# Patient Record
Sex: Female | Born: 1950 | Race: White | Hispanic: No | Marital: Married | Smoking: Light tobacco smoker
Health system: Southern US, Community
[De-identification: ages and names within clinical notes are randomized; demographics above are authoritative.]

## PROBLEM LIST (undated history)

## (undated) DIAGNOSIS — K56609 Unspecified intestinal obstruction, unspecified as to partial versus complete obstruction: Secondary | ICD-10-CM

## (undated) DIAGNOSIS — I714 Abdominal aortic aneurysm, without rupture: Principal | ICD-10-CM

## (undated) DIAGNOSIS — T8859XA Other complications of anesthesia, initial encounter: Secondary | ICD-10-CM

## (undated) DIAGNOSIS — R059 Cough, unspecified: Secondary | ICD-10-CM

## (undated) DIAGNOSIS — I10 Essential (primary) hypertension: Secondary | ICD-10-CM

## (undated) DIAGNOSIS — R519 Headache, unspecified: Secondary | ICD-10-CM

## (undated) DIAGNOSIS — T7840XA Allergy, unspecified, initial encounter: Secondary | ICD-10-CM

## (undated) DIAGNOSIS — R05 Cough: Secondary | ICD-10-CM

## (undated) DIAGNOSIS — R3129 Other microscopic hematuria: Secondary | ICD-10-CM

## (undated) DIAGNOSIS — R51 Headache: Secondary | ICD-10-CM

## (undated) DIAGNOSIS — K219 Gastro-esophageal reflux disease without esophagitis: Secondary | ICD-10-CM

## (undated) DIAGNOSIS — J069 Acute upper respiratory infection, unspecified: Secondary | ICD-10-CM

## (undated) DIAGNOSIS — T4145XA Adverse effect of unspecified anesthetic, initial encounter: Secondary | ICD-10-CM

## (undated) DIAGNOSIS — Z8709 Personal history of other diseases of the respiratory system: Secondary | ICD-10-CM

## (undated) DIAGNOSIS — G4700 Insomnia, unspecified: Secondary | ICD-10-CM

## (undated) DIAGNOSIS — E785 Hyperlipidemia, unspecified: Secondary | ICD-10-CM

## (undated) HISTORY — PX: COLONOSCOPY: SHX174

## (undated) HISTORY — DX: Allergy, unspecified, initial encounter: T78.40XA

## (undated) HISTORY — DX: Insomnia, unspecified: G47.00

## (undated) HISTORY — PX: TONSILLECTOMY: SUR1361

## (undated) HISTORY — DX: Hyperlipidemia, unspecified: E78.5

## (undated) HISTORY — DX: Essential (primary) hypertension: I10

## (undated) HISTORY — DX: Abdominal aortic aneurysm, without rupture: I71.4

---
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
## 1984-02-18 HISTORY — PX: BREAST FIBROADENOMA SURGERY: SHX580

## 1999-01-04 ENCOUNTER — Other Ambulatory Visit: Admission: RE | Admit: 1999-01-04 | Discharge: 1999-01-04 | Payer: Self-pay | Admitting: Gynecology

## 1999-12-05 ENCOUNTER — Encounter: Payer: Self-pay | Admitting: Gynecology

## 1999-12-05 ENCOUNTER — Ambulatory Visit (HOSPITAL_COMMUNITY): Admission: RE | Admit: 1999-12-05 | Discharge: 1999-12-05 | Payer: Self-pay | Admitting: Gynecology

## 2001-03-03 ENCOUNTER — Ambulatory Visit (HOSPITAL_COMMUNITY): Admission: RE | Admit: 2001-03-03 | Discharge: 2001-03-03 | Payer: Self-pay | Admitting: Emergency Medicine

## 2001-03-03 ENCOUNTER — Encounter: Payer: Self-pay | Admitting: Emergency Medicine

## 2002-04-06 ENCOUNTER — Encounter: Payer: Self-pay | Admitting: Emergency Medicine

## 2002-04-06 ENCOUNTER — Ambulatory Visit (HOSPITAL_COMMUNITY): Admission: RE | Admit: 2002-04-06 | Discharge: 2002-04-06 | Payer: Self-pay | Admitting: Emergency Medicine

## 2003-09-18 ENCOUNTER — Ambulatory Visit (HOSPITAL_COMMUNITY): Admission: RE | Admit: 2003-09-18 | Discharge: 2003-09-18 | Payer: Self-pay | Admitting: Emergency Medicine

## 2004-05-03 ENCOUNTER — Encounter: Admission: RE | Admit: 2004-05-03 | Discharge: 2004-05-03 | Payer: Self-pay | Admitting: Emergency Medicine

## 2004-09-19 ENCOUNTER — Ambulatory Visit (HOSPITAL_COMMUNITY): Admission: RE | Admit: 2004-09-19 | Discharge: 2004-09-19 | Payer: Self-pay | Admitting: Emergency Medicine

## 2005-07-04 ENCOUNTER — Emergency Department (HOSPITAL_COMMUNITY): Admission: EM | Admit: 2005-07-04 | Discharge: 2005-07-04 | Payer: Self-pay | Admitting: Emergency Medicine

## 2006-01-01 ENCOUNTER — Ambulatory Visit (HOSPITAL_COMMUNITY): Admission: RE | Admit: 2006-01-01 | Discharge: 2006-01-01 | Payer: Self-pay | Admitting: Emergency Medicine

## 2006-01-07 ENCOUNTER — Encounter: Admission: RE | Admit: 2006-01-07 | Discharge: 2006-01-07 | Payer: Self-pay | Admitting: Emergency Medicine

## 2007-09-08 ENCOUNTER — Encounter: Admission: RE | Admit: 2007-09-08 | Discharge: 2007-09-08 | Payer: Self-pay | Admitting: Emergency Medicine

## 2008-01-25 ENCOUNTER — Ambulatory Visit (HOSPITAL_COMMUNITY): Admission: RE | Admit: 2008-01-25 | Discharge: 2008-01-25 | Payer: Self-pay | Admitting: Emergency Medicine

## 2009-01-19 ENCOUNTER — Encounter: Admission: RE | Admit: 2009-01-19 | Discharge: 2009-01-19 | Payer: Self-pay | Admitting: Emergency Medicine

## 2010-03-10 ENCOUNTER — Encounter: Payer: Self-pay | Admitting: Emergency Medicine

## 2010-11-29 ENCOUNTER — Inpatient Hospital Stay (INDEPENDENT_AMBULATORY_CARE_PROVIDER_SITE_OTHER)
Admission: RE | Admit: 2010-11-29 | Discharge: 2010-11-29 | Disposition: A | Discharge status: Home / Self Care | Payer: Self-pay | Source: Ambulatory Visit | Attending: Emergency Medicine | Admitting: Emergency Medicine

## 2010-11-29 ENCOUNTER — Ambulatory Visit (INDEPENDENT_AMBULATORY_CARE_PROVIDER_SITE_OTHER): Payer: Self-pay

## 2010-11-29 DIAGNOSIS — R071 Chest pain on breathing: Secondary | ICD-10-CM

## 2010-11-29 DIAGNOSIS — N6019 Diffuse cystic mastopathy of unspecified breast: Secondary | ICD-10-CM

## 2011-05-14 ENCOUNTER — Encounter (HOSPITAL_COMMUNITY): Payer: Self-pay | Admitting: *Deleted

## 2011-05-14 ENCOUNTER — Emergency Department (INDEPENDENT_AMBULATORY_CARE_PROVIDER_SITE_OTHER)
Admission: EM | Admit: 2011-05-14 | Discharge: 2011-05-14 | Disposition: A | Discharge status: Home / Self Care | Payer: Self-pay | Source: Home / Self Care | Attending: Emergency Medicine | Admitting: Emergency Medicine

## 2011-05-14 DIAGNOSIS — K4090 Unilateral inguinal hernia, without obstruction or gangrene, not specified as recurrent: Secondary | ICD-10-CM

## 2011-05-14 DIAGNOSIS — N390 Urinary tract infection, site not specified: Secondary | ICD-10-CM

## 2011-05-14 MED ORDER — TRAMADOL HCL 50 MG PO TABS: 100.0000 mg | ORAL_TABLET | Freq: Three times a day (TID) | ORAL | Status: AC | PRN

## 2011-05-14 MED ORDER — SULFAMETHOXAZOLE-TMP DS 800-160 MG PO TABS: 1.0000 | ORAL_TABLET | Freq: Two times a day (BID) | ORAL | Status: AC

## 2011-05-14 NOTE — ED Notes (Signed)
Pt    Not in  tx room when went  To  discharge  Her  Dr  Lorenz Coaster   States  He   Explained  Plan of  Care  To pt   Prior  To  Discharge       Attempted  To  Call pt  At  Home  But  No  Answer        rx    Was  E  rx

## 2011-05-14 NOTE — ED Notes (Signed)
Called      Gloria Morrison  At  [facility]    And    Explained              Plan of  Care  She  Says      She        Understands  Info  Faxed  To the  Fax number  She provided   To me

## 2011-05-14 NOTE — Discharge Instructions (Signed)
Inguinal Hernia, Adult  Muscles help keep everything in the body in its proper place. But if a weak spot in the muscles develops, something can poke through. That is called a hernia. When this happens in the lower part of the belly (abdomen), it is called an inguinal hernia. (It takes its name from a part of the body in this region called the inguinal canal.) A weak spot in the wall of muscles lets some fat or part of the small intestine bulge through. An inguinal hernia can develop at any age. Men get them more often than women.  CAUSES   In adults, an inguinal hernia develops over time.  · It can be triggered by:  · Suddenly straining the muscles of the lower abdomen.  · Lifting heavy objects.  · Straining to have a bowel movement. Difficult bowel movements (constipation) can lead to this.  · Constant coughing. This may be caused by smoking or lung disease.  · Being overweight.  · Being pregnant.  · Working at a job that requires long periods of standing or heavy lifting.  · Having had an inguinal hernia before.  One type can be an emergency situation. It is called a strangulated inguinal hernia. It develops if part of the small intestine slips through the weak spot and cannot get back into the abdomen. The blood supply can be cut off. If that happens, part of the intestine may die. This situation requires emergency surgery.  SYMPTOMS   Often, a small inguinal hernia has no symptoms. It is found when a healthcare provider does a physical exam. Larger hernias usually have symptoms.   · In adults, symptoms may include:  · A lump in the groin. This is easier to see when the person is standing. It might disappear when lying down.  · In men, a lump in the scrotum.  · Pain or burning in the groin. This occurs especially when lifting, straining or coughing.  · A dull ache or feeling of pressure in the groin.  · Signs of a strangulated hernia can include:  · A bulge in the groin that becomes very painful and tender to the  touch.  · A bulge that turns red or purple.  · Fever, nausea and vomiting.  · Inability to have a bowel movement or to pass gas.  DIAGNOSIS   To decide if you have an inguinal hernia, a healthcare provider will probably do a physical examination.  · This will include asking questions about any symptoms you have noticed.  · The healthcare provider might feel the groin area and ask you to cough. If an inguinal hernia is felt, the healthcare provider may try to slide it back into the abdomen.  · Usually no other tests are needed.  TREATMENT   Treatments can vary. The size of the hernia makes a difference. Options include:  · Watchful waiting. This is often suggested if the hernia is small and you have had no symptoms.  · No medical procedure will be done unless symptoms develop.  · You will need to watch closely for symptoms. If any occur, contact your healthcare provider right away.  · Surgery. This is used if the hernia is larger or you have symptoms.  · Open surgery. This is usually an outpatient procedure (you will not stay overnight in a hospital). An cut (incision) is made through the skin in the groin. The hernia is put back inside the abdomen. The weak area in the muscles is   then repaired by herniorrhaphy or hernioplasty. Herniorrhaphy: in this type of surgery, the weak muscles are sewn back together. Hernioplasty: a patch or mesh is used to close the weak area in the abdominal wall.  · Laparoscopy. In this procedure, a surgeon makes small incisions. A thin tube with a tiny video camera (called a laparoscope) is put into the abdomen. The surgeon repairs the hernia with mesh by looking with the video camera and using two long instruments.  HOME CARE INSTRUCTIONS   · After surgery to repair an inguinal hernia:  · You will need to take pain medicine prescribed by your healthcare provider. Follow all directions carefully.  · You will need to take care of the wound from the incision.  · Your activity will be  restricted for awhile. This will probably include no heavy lifting for several weeks. You also should not do anything too active for a few weeks. When you can return to work will depend on the type of job that you have.  · During "watchful waiting" periods, you should:  · Maintain a healthy weight.  · Eat a diet high in fiber (fruits, vegetables and whole grains).  · Drink plenty of fluids to avoid constipation. This means drinking enough water and other liquids to keep your urine clear or pale yellow.  · Do not lift heavy objects.  · Do not stand for long periods of time.  · Quit smoking. This should keep you from developing a frequent cough.  SEEK MEDICAL CARE IF:   · A bulge develops in your groin area.  · You feel pain, a burning sensation or pressure in the groin. This might be worse if you are lifting or straining.  · You develop a fever of more than 100.5° F (38.1° C).  SEEK IMMEDIATE MEDICAL CARE IF:   · Pain in the groin increases suddenly.  · A bulge in the groin gets bigger suddenly and does not go down.  · For men, there is sudden pain in the scrotum. Or, the size of the scrotum increases.  · A bulge in the groin area becomes red or purple and is painful to touch.  · You have nausea or vomiting that does not go away.  · You feel your heart beating much faster than normal.  · You cannot have a bowel movement or pass gas.  · You develop a fever of more than 102.0° F (38.9° C).  Document Released: 06/22/2008 Document Revised: 01/23/2011 Document Reviewed: 06/22/2008  Exit[employer]® Patient Information ©2012 [employer].

## 2011-05-14 NOTE — ED Notes (Signed)
pT  REPORTS   PAIN DOWN  L  LEG   SHE  REPORTS        THE  PAIN IS  WORSE      WHEN  SHE  COUGHS        THE  PAIN  RUNS  DOWN THE  L  LEG         THE  SYMPTOMS   HAVE  BEEN  FOR  3  DAYS      SHE IS  AMBULATORY   TO  ROOM  WITH A  STEADY  UPRIGHT  GAIT

## 2011-05-14 NOTE — ED Provider Notes (Signed)
Chief Complaint  Patient presents with  . Leg Pain    History of Present Illness:   Gloria Morrison is a 61 year old female who has had a three-day history of pain in her left groin area. This seemed to occur after she had a bad cough. The pain is worse with coughing, straining, sitting up, moving, or lifting. The cough is now better, but the pain persisted and she also yesterday noted some pain in her left lateral calf and right lateral calf as well and some right hip pain, but this today seems to be better. She denies any fever, chills, nausea, or vomiting. There's been no urinary symptoms or GYN complaints. There's been no swelling of her legs, numbness, tingling, or weakness. She cannot feel a bulge or lump in the groin area.  She also mentions she thinks he is in early stages of a urinary tract infection. Sulfa has worked well for this in the past.  Review of Systems:  Other than noted above, the patient denies any of the following symptoms: Constitutional:  No fever, chills, fatigue, weight loss or anorexia. Lungs:  No cough or shortness of breath. Heart:  No chest pain, palpitations, syncope or edema. Abdomen:  No nausea, vomiting, hematememesis, melena, diarrhea, or hematochezia. GU:  No dysuria, frequency, urgency, or hematuria. Gyn:  No vaginal discharge, itching, abnormal bleeding or pelvic pain. Skin:  No rash or itching.  PMFSH:  Past medical history, family history, social history, meds, and allergies were reviewed.  Physical Exam:   Vital signs:  BP 133/88  Pulse 99  Temp(Src) 98.4 F (36.9 C) (Oral)  Resp 20  SpO2 98% Gen:  Alert, oriented, in no distress. Lungs:  Breath sounds clear and equal bilaterally.  No wheezes, rales or rhonchi. Heart:  Regular rhythm.  No gallops or murmers.   Abdomen:  Abdomen was soft, flat, and nondistended. There was some pain to palpation in the left in the area. I can feel a tiny inguinal hernia that is easily reducible but is tender to touch. There  is no inguinal lymphadenopathy. No organomegaly or mass. Bowel sounds are normally active. Extremities: No swelling, pulses were full, no calf tenderness, Homans sign was negative. Neurological exam: Normal muscle strength and sensation. Knee reflexes were 1+, ankle reflexes were 0. Straight leg raising was negative. Skin:  Clear, warm and dry.  No rash.  Assessment:   Diagnoses that have been ruled out:  None  Diagnoses that are still under consideration:  None  Final diagnoses:  Inguinal hernia  Urinary tract infection    Plan:   1.  The following meds were prescribed:   New Prescriptions   SULFAMETHOXAZOLE-TRIMETHOPRIM (BACTRIM DS) 800-160 MG PER TABLET    Take 1 tablet by mouth 2 (two) times daily.   TRAMADOL (ULTRAM) 50 MG TABLET    Take 2 tablets (100 mg total) by mouth every 8 (eight) hours as needed for pain.   2.  The patient was instructed in symptomatic care and handouts were given. 3.  The patient was told to return if becoming worse in any way, if no better in 3 or 4 days, and given some red flag symptoms that would indicate earlier return.  Follow up:  The patient was told to follow up with Dr. Ovidio Kin for the hernia as soon as possible. In the meantime she is to refrain from heavy lifting, straining, pushing, or pulling. She should apply ice to the area and rest as much as possible. She was  given tramadol for the pain.      Reuben Likes, MD 05/14/11 2222

## 2011-05-16 ENCOUNTER — Telehealth (HOSPITAL_COMMUNITY): Payer: Self-pay | Admitting: *Deleted

## 2011-05-16 NOTE — ED Notes (Signed)
Pt. called this AM and left message to call. I called pt. 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  She saw Dr. Lorenz Coaster for abd. Pain on 3/27 and was in the waiting room with a bunch if sick people.  She said now she has a sore throat, congestion and coughing and wants something for it. I told her we are not a primary care facility and we can't call in a Rx. without seeing her first. I told her this is a completely different problem than what she came for, so she will have to see the doctor again to be treated. Pt. asked when we open tomorrow and I said 0900. Vassie Moselle 05/16/2011

## 2012-07-24 DIAGNOSIS — N952 Postmenopausal atrophic vaginitis: Secondary | ICD-10-CM | POA: Insufficient documentation

## 2012-11-04 DIAGNOSIS — J309 Allergic rhinitis, unspecified: Secondary | ICD-10-CM | POA: Insufficient documentation

## 2012-11-04 DIAGNOSIS — G4700 Insomnia, unspecified: Secondary | ICD-10-CM | POA: Insufficient documentation

## 2012-11-04 DIAGNOSIS — E785 Hyperlipidemia, unspecified: Secondary | ICD-10-CM | POA: Insufficient documentation

## 2013-01-26 DIAGNOSIS — J40 Bronchitis, not specified as acute or chronic: Secondary | ICD-10-CM | POA: Insufficient documentation

## 2014-07-06 ENCOUNTER — Ambulatory Visit (INDEPENDENT_AMBULATORY_CARE_PROVIDER_SITE_OTHER): Payer: 59 | Admitting: Vascular Surgery

## 2014-07-06 ENCOUNTER — Encounter: Payer: Self-pay | Admitting: Vascular Surgery

## 2014-07-06 VITALS — BP 125/80 | HR 82 | Temp 98.0°F | Resp 18 | Ht 65.5 in | Wt 168.4 lb

## 2014-07-06 DIAGNOSIS — I714 Abdominal aortic aneurysm, without rupture, unspecified: Secondary | ICD-10-CM | POA: Insufficient documentation

## 2014-07-06 DIAGNOSIS — Z01810 Encounter for preprocedural cardiovascular examination: Secondary | ICD-10-CM

## 2014-07-06 HISTORY — DX: Abdominal aortic aneurysm, without rupture: I71.4

## 2014-07-06 HISTORY — DX: Abdominal aortic aneurysm, without rupture, unspecified: I71.40

## 2014-07-06 NOTE — Addendum Note (Signed)
Addended by: Mena Goes on: 07/06/2014 02:35 PM   Modules accepted: Orders

## 2014-07-06 NOTE — Progress Notes (Signed)
[facility]ISTORY AND PHYSICAL   History of Present Illness:  Patient is a 64 y.o. year old female who presents for evaluation of abdominal aortic aneurysm.  Patient was noted recently to have a 7 cm infrarenal abdominal aortic aneurysm on a CT scan done for evaluation of hematuria. The patient has known she had an aneurysm for approximately 8 years. She has had intermittent surveillance scans of this but admits that she had missed several scans in the last few years. She denies severe abdominal or back pain. She is a smoker of approximately one half pack per day. She is trying to quit. Other medical problems include hyperlipidemia, hypertension, microscopic hematuria no further workup planned. All of these current medical problems are stable.  Past Medical History  Diagnosis Date  . Hyperlipidemia   . Insomnia   . Postmenopausal atrophic vaginitis   . Bronchitis   . Hypertension   . Hematuria     Past Surgical History  Procedure Laterality Date  . Breast fibroadenoma surgery Left 1986  . Tonsillectomy      done as a child    Social History History  Substance Use Topics  . Smoking status: Current Every Day Smoker -- 0.25 packs/day  . Smokeless tobacco: Never Used  . Alcohol Use: 3.0 oz/week    5 Cans of beer per week    Family History Family History  Problem Relation Age of Onset  . Cancer Father     Melanoma  . Hypertension Sister     Allergies  No Known Allergies   Current Outpatient Prescriptions  Medication Sig Dispense Refill  . aspirin EC 81 MG tablet Take 81 mg by mouth.    . calcium carbonate 1250 MG capsule Take 1,250 mg by mouth.    . Coenzyme Q10 (CO Q 10) 10 MG CAPS Take 100 mg by mouth.    . estradiol (ESTRACE) 0.1 MG/GM vaginal cream Place vaginally.    Marland Kitchen lisinopril (PRINIVIL,ZESTRIL) 5 MG tablet Take 5 mg by mouth daily.     Marland Kitchen LORazepam (ATIVAN) 1 MG tablet Take 1 mg by mouth every 8 (eight) hours as needed.     .  Multiple Vitamins-Minerals (ONE DAILY PLUS MINERALS) TABS Take by mouth.     No current facility-administered medications for this visit.    ROS:   General:  No weight loss, Fever, chills  HEENT: No recent headaches, no nasal bleeding, no visual changes, no sore throat  Neurologic: No dizziness, blackouts, seizures. No recent symptoms of stroke or mini- stroke. No recent episodes of slurred speech, or temporary blindness.  Cardiac: No recent episodes of chest pain/pressure, no shortness of breath at rest.  No shortness of breath with exertion.  Denies history of atrial fibrillation or irregular heartbeat  Vascular: No history of rest pain in feet.  No history of claudication.  No history of non-healing ulcer, No history of DVT   Pulmonary: No home oxygen, no productive cough, no hemoptysis,  No asthma or wheezing  Musculoskeletal:  [ ]  Arthritis, [ ]  Low back pain,  [ ]  Joint pain  Hematologic:No history of hypercoagulable state.  No history of easy bleeding.  No history of anemia  Gastrointestinal: No hematochezia or melena,  No gastroesophageal reflux, no trouble swallowing  Urinary: [ ]  chronic Kidney disease, [ ]  on HD - [ ]  MWF or [ ]  TTHS, [ ]  Burning with urination, [ ]  Frequent urination, [ ]  Difficulty urinating;   Skin: No  rashes  Psychological: No history of anxiety,  No history of depression   Physical Examination  Filed Vitals:   07/06/14 1134  BP: 125/80  Pulse: 82  Temp: 98 F (36.7 C)  TempSrc: Oral  Resp: 18  Height: 5' 5.5" (1.664 m)  Weight: 168 lb 6.4 oz (76.386 kg)  SpO2: 98%    Body mass index is 27.59 kg/(m^2).  General:  Alert and oriented, no acute distress HEENT: Normal Neck: No bruit or JVD Pulmonary: Clear to auscultation bilaterally Cardiac: Regular Rate and Rhythm without murmur Abdomen: Soft, non-tender, non-distended, no mass, no scars Skin: No rash Extremity Pulses:  2+ radial, brachial, femoral, dorsalis pedis, posterior tibial  pulses bilaterally Musculoskeletal: No deformity or edema  Neurologic: Upper and lower extremity motor 5/5 and symmetric  DATA:  CT scan of the abdomen and pelvis a 5 mm cuts is reviewed today. This shows a 7 cm abdominal aortic aneurysm with some tortuosity of the iliacs but no aneurysmal dilatation of the iliac arteries. The infrarenal neck seems suitable for stent graft repair but will need a fine cut CT to further determine this   ASSESSMENT:  7 cm abdominal aortic aneurysm at risk of rupture.  PLAN:  The patient will undergo cardiac risk stratification. I have tentatively scheduled her for repair of her aneurysm on June 1 with a Gore Excluder stent graft. She will have a repeat CT scan with thin cuts for further measurement and planning for her aneurysm stent graft. She will try to quit smoking.  Ruta Hinds, MD [facility] [place] Office: [phone] Pager: [phone]

## 2014-07-07 ENCOUNTER — Encounter: Payer: Self-pay | Admitting: Vascular Surgery

## 2014-07-07 ENCOUNTER — Telehealth: Payer: Self-pay | Admitting: Vascular Surgery

## 2014-07-07 ENCOUNTER — Other Ambulatory Visit: Payer: Self-pay

## 2014-07-07 NOTE — Telephone Encounter (Signed)
Spoke with pt. Gave her the following: CTA 07/13/14 2:55 pm [facility] (labs already done & faxed) Cardiology appointment [facility] 07/12/14 1:20 pm Dr. Domenic Polite for preop clearance. Pt verbalized understanding.

## 2014-07-11 ENCOUNTER — Encounter: Payer: Self-pay | Admitting: Diagnostic Radiology

## 2014-07-12 ENCOUNTER — Encounter: Payer: Self-pay | Admitting: Cardiology

## 2014-07-12 ENCOUNTER — Ambulatory Visit (INDEPENDENT_AMBULATORY_CARE_PROVIDER_SITE_OTHER): Payer: 59 | Admitting: Cardiology

## 2014-07-12 ENCOUNTER — Other Ambulatory Visit: Payer: Self-pay | Admitting: *Deleted

## 2014-07-12 ENCOUNTER — Encounter: Payer: Self-pay | Admitting: *Deleted

## 2014-07-12 VITALS — BP 128/78 | HR 69 | Ht 66.0 in | Wt 169.0 lb

## 2014-07-12 DIAGNOSIS — I714 Abdominal aortic aneurysm, without rupture, unspecified: Secondary | ICD-10-CM

## 2014-07-12 DIAGNOSIS — I10 Essential (primary) hypertension: Secondary | ICD-10-CM | POA: Diagnosis not present

## 2014-07-12 DIAGNOSIS — Z01818 Encounter for other preprocedural examination: Secondary | ICD-10-CM

## 2014-07-12 DIAGNOSIS — Z136 Encounter for screening for cardiovascular disorders: Secondary | ICD-10-CM | POA: Diagnosis not present

## 2014-07-12 DIAGNOSIS — Z01810 Encounter for preprocedural cardiovascular examination: Secondary | ICD-10-CM

## 2014-07-12 DIAGNOSIS — E785 Hyperlipidemia, unspecified: Secondary | ICD-10-CM

## 2014-07-12 NOTE — Patient Instructions (Signed)
We will call you with test results  Your physician has requested that you have a lexiscan myoview. For further information please visit [email or web address]. Please follow instruction sheet, as given.  Thank you for choosing [facility]!

## 2014-07-12 NOTE — Progress Notes (Signed)
Cardiology Office Note  Date: 07/12/2014   ID: Gloria, Morrison 1950/04/16, MRN [id number]  PCP: Berkley Harvey, NP  Consulting Cardiologist: Rozann Lesches, MD   Chief Complaint  Patient presents with  . Preoperative evaluation    History of Present Illness: Gloria Morrison is a 64 y.o. female referred for cardiology consultation by Dr. Oneida Alar. She has a recently documented 7 cm infrarenal abdominal aortic aneurysm and is tentatively scheduled for stent graft repair on June 1.  I reviewed her available history. She has been aware of having an abdominal aortic aneurysm for several years now, and has been asymptomatic so far. She has a history of hypertension and hyperlipidemia, states that lisinopril is her most recent medication addition.    From a cardiac perspective, she denies any known history of CAD, cardiomyopathy, or arrhythmia. She states that she does Yoga regularly, and is trying to get back into a regular walking regimen. She does not specifically endorse any angina or limiting shortness of breath beyond NYHA class II. She does have a long-standing history of tobacco use, states that she is focused on trying to quit.  ECG done today shows normal sinus rhythm.  She has not undergone any prior cardiac ischemic testing.   Past Medical History  Diagnosis Date  . Hyperlipidemia   . Insomnia   . Postmenopausal atrophic vaginitis   . Bronchitis   . Essential hypertension   . Hematuria   . AAA (abdominal aortic aneurysm) without rupture 07/06/2014    Past Surgical History  Procedure Laterality Date  . Breast fibroadenoma surgery Left 1986  . Tonsillectomy      Current Outpatient Prescriptions  Medication Sig Dispense Refill  . aspirin EC 81 MG tablet Take 81 mg by mouth.    . calcium carbonate 1250 MG capsule Take 1,250 mg by mouth.    . Coenzyme Q10 (CO Q 10) 10 MG CAPS Take 100 mg by mouth.    . estradiol (ESTRACE) 0.1 MG/GM vaginal cream Place vaginally.      Marland Kitchen lisinopril (PRINIVIL,ZESTRIL) 5 MG tablet Take 5 mg by mouth daily.     Marland Kitchen LORazepam (ATIVAN) 1 MG tablet Take 1 mg by mouth every 8 (eight) hours as needed.     . Multiple Vitamins-Minerals (ONE DAILY PLUS MINERALS) TABS Take by mouth.     No current facility-administered medications for this visit.    Allergies:  Review of patient's allergies indicates no known allergies.   Social History: The patient  reports that she has been smoking Cigarettes.  She started smoking about 23 years ago. She has been smoking about 0.25 packs per day. She has never used smokeless tobacco. She reports that she drinks about 3.0 oz of alcohol per week. She reports that she does not use illicit drugs.   Family History: The patient's family history includes Hypertension in her sister; Melanoma in her father.   ROS:  Please see the history of present illness. Otherwise, complete review of systems is positive for none.  All other systems are reviewed and negative.   Physical Exam: VS:  BP 128/78 mmHg  Pulse 69  Ht 5\' 6"  (1.676 m)  Wt 169 lb (76.658 kg)  BMI 27.29 kg/m2  SpO2 96%, BMI Body mass index is 27.29 kg/(m^2).  Wt Readings from Last 3 Encounters:  07/12/14 169 lb (76.658 kg)  07/06/14 168 lb 6.4 oz (76.386 kg)     General: Patient appears comfortable at rest. HEENT: Conjunctiva and  lids normal, oropharynx clear. Neck: Supple, no elevated JVP or carotid bruits, no thyromegaly. Lungs: Clear to auscultation, nonlabored breathing at rest. Cardiac: Regular rate and rhythm, no S3 or significant systolic murmur, no pericardial rub. Abdomen: Soft, nontender, bowel sounds present, no guarding or rebound. Extremities: No pitting edema, distal pulses 2+. Skin: Warm and dry. Musculoskeletal: No kyphosis. Neuropsychiatric: Alert and oriented x3, affect grossly appropriate.   ECG: ECG is ordered today and shows normal sinus rhythm.  Recent Labwork:  02/16/2014: potassium 4.4, BUN 11, creatinine  0.4, AST 16, ALT 15 , hemoglobin 13.1, platelets 377 , cholesterol 221, triglycerides 156, HDL 58, LDL 132   ASSESSMENT AND PLAN:  1. Preoperative evaluation in a 64 year old woman with long-standing history of tobacco abuse, also hypertension and hyperlipidemia. She has a large infrarenal abdominal aortic aneurysm measuring 7 cm and is scheduled for a stent graft repair next week with Dr. Oneida Alar. Resting ECG is normal. She has not had any prior ischemic evaluation, and we will schedule a Lexiscan Cardiolite this week for further risk stratification.  2. Essential hypertension, on lisinopril, blood pressure is reasonable today.  3. History of hyperlipidemia, LDL 132. She is on CoQ10.  4. Tobacco abuse. Smoking cessation discussed, she seems to be motivated to quit.  Current medicines were reviewed at length with the patient today.   Orders Placed This Encounter  Procedures  . NM Myocar Multi W/Spect W/Wall Motion / EF  . Myocardial Perfusion Imaging  . EKG 12-Lead    Disposition: Call with results.   Signed, Satira Sark, MD, [facility] 07/12/2014 2:17 PM    [facility] [address], [place], [place] [address] Phone: [phone]; Fax: [phone]

## 2014-07-13 ENCOUNTER — Ambulatory Visit
Admission: RE | Admit: 2014-07-13 | Discharge: 2014-07-13 | Disposition: A | Discharge status: Home / Self Care | Payer: 59 | Source: Ambulatory Visit | Attending: Vascular Surgery | Admitting: Vascular Surgery

## 2014-07-13 MED ORDER — IOPAMIDOL (ISOVUE-370) INJECTION 76%
75.0000 mL | Freq: Once | INTRAVENOUS | Status: AC | PRN
  Administered 2014-07-13: 75 mL via INTRAVENOUS

## 2014-07-14 ENCOUNTER — Encounter (HOSPITAL_COMMUNITY)
Admission: RE | Admit: 2014-07-14 | Discharge: 2014-07-14 | Disposition: A | Discharge status: Home / Self Care | Payer: 59 | Source: Ambulatory Visit | Attending: Cardiology | Admitting: Cardiology

## 2014-07-14 ENCOUNTER — Inpatient Hospital Stay (HOSPITAL_COMMUNITY): Admission: RE | Admit: 2014-07-14 | Payer: 59 | Source: Ambulatory Visit

## 2014-07-14 ENCOUNTER — Telehealth: Payer: Self-pay | Admitting: Cardiology

## 2014-07-14 ENCOUNTER — Encounter (HOSPITAL_COMMUNITY): Payer: Self-pay

## 2014-07-14 DIAGNOSIS — Z01818 Encounter for other preprocedural examination: Secondary | ICD-10-CM | POA: Diagnosis present

## 2014-07-14 LAB — NM MYOCAR MULTI W/SPECT W/WALL MOTION / EF
CSEPPHR: 88 {beats}/min
LV dias vol: 77 mL
LV sys vol: 27 mL
NUC STRESS EF: 65 %
NUC STRESS TID: 1.13
Rest HR: 61 {beats}/min
SDS: 6
SRS: 0
SSS: 6

## 2014-07-14 MED ORDER — SODIUM CHLORIDE 0.9 % IJ SOLN
INTRAMUSCULAR | Status: AC
  Administered 2014-07-14: 10 mL via INTRAVENOUS
  Filled 2014-07-14: qty 3

## 2014-07-14 MED ORDER — SODIUM CHLORIDE 0.9 % IJ SOLN
10.0000 mL | INTRAMUSCULAR | Status: DC | PRN
  Administered 2014-07-14: 10 mL via INTRAVENOUS
  Filled 2014-07-14: qty 10

## 2014-07-14 MED ORDER — TECHNETIUM TC 99M SESTAMIBI GENERIC - CARDIOLITE
30.0000 | Freq: Once | INTRAVENOUS | Status: AC | PRN
  Administered 2014-07-14: 30 via INTRAVENOUS

## 2014-07-14 MED ORDER — REGADENOSON 0.4 MG/5ML IV SOLN
0.4000 mg | Freq: Once | INTRAVENOUS | Status: AC
  Administered 2014-07-14: 0.4 mg via INTRAVENOUS
  Filled 2014-07-14: qty 5

## 2014-07-14 MED ORDER — REGADENOSON 0.4 MG/5ML IV SOLN
INTRAVENOUS | Status: AC
Start: 2014-07-14 — End: 2014-07-14
  Administered 2014-07-14: 0.4 mg via INTRAVENOUS
  Filled 2014-07-14: qty 5

## 2014-07-14 MED ORDER — TECHNETIUM TC 99M SESTAMIBI - CARDIOLITE
10.0000 | Freq: Once | INTRAVENOUS | Status: AC | PRN
  Administered 2014-07-14: 08:00:00 10.2 via INTRAVENOUS

## 2014-07-14 NOTE — Telephone Encounter (Signed)
Results of tests done yesterday / tg

## 2014-07-18 ENCOUNTER — Encounter (HOSPITAL_COMMUNITY)
Admission: RE | Admit: 2014-07-18 | Discharge: 2014-07-18 | Disposition: A | Discharge status: Home / Self Care | Payer: 59 | Source: Ambulatory Visit | Attending: Vascular Surgery | Admitting: Vascular Surgery

## 2014-07-18 ENCOUNTER — Encounter (HOSPITAL_COMMUNITY): Payer: Self-pay

## 2014-07-18 ENCOUNTER — Other Ambulatory Visit: Payer: Self-pay

## 2014-07-18 DIAGNOSIS — N390 Urinary tract infection, site not specified: Secondary | ICD-10-CM

## 2014-07-18 HISTORY — DX: Other microscopic hematuria: R31.29

## 2014-07-18 HISTORY — DX: Other complications of anesthesia, initial encounter: T88.59XA

## 2014-07-18 HISTORY — DX: Adverse effect of unspecified anesthetic, initial encounter: T41.45XA

## 2014-07-18 HISTORY — DX: Cough, unspecified: R05.9

## 2014-07-18 HISTORY — DX: Headache, unspecified: R51.9

## 2014-07-18 HISTORY — DX: Headache: R51

## 2014-07-18 HISTORY — DX: Gastro-esophageal reflux disease without esophagitis: K21.9

## 2014-07-18 HISTORY — DX: Cough: R05

## 2014-07-18 HISTORY — DX: Personal history of other diseases of the respiratory system: Z87.09

## 2014-07-18 HISTORY — DX: Acute upper respiratory infection, unspecified: J06.9

## 2014-07-18 LAB — COMPREHENSIVE METABOLIC PANEL
ALBUMIN: 3.8 g/dL (ref 3.5–5.0)
ALK PHOS: 76 U/L (ref 38–126)
ALT: 17 U/L (ref 14–54)
AST: 17 U/L (ref 15–41)
Anion gap: 13 (ref 5–15)
BUN: 19 mg/dL (ref 6–20)
CALCIUM: 9.4 mg/dL (ref 8.9–10.3)
CHLORIDE: 97 mmol/L — AB (ref 101–111)
CO2: 22 mmol/L (ref 22–32)
Creatinine, Ser: 0.48 mg/dL (ref 0.44–1.00)
GFR calc non Af Amer: >60 mL/min (ref >60)
GLUCOSE: 103 mg/dL — AB (ref 65–99)
POTASSIUM: 4.2 mmol/L (ref 3.5–5.1)
SODIUM: 132 mmol/L — AB (ref 135–145)
TOTAL PROTEIN: 7 g/dL (ref 6.5–8.1)
Total Bilirubin: 0.6 mg/dL (ref 0.3–1.2)

## 2014-07-18 LAB — APTT: aPTT: 37 seconds (ref 24–37)

## 2014-07-18 LAB — CBC
HCT: 38.8 % (ref 36.0–46.0)
Hemoglobin: 13.3 g/dL (ref 12.0–15.0)
MCH: 30.2 pg (ref 26.0–34.0)
MCHC: 34.3 g/dL (ref 30.0–36.0)
MCV: 88.2 fL (ref 78.0–100.0)
PLATELETS: 340 10*3/uL (ref 150–400)
RBC: 4.4 MIL/uL (ref 3.87–5.11)
RDW: 14.1 % (ref 11.5–15.5)
WBC: 11.3 10*3/uL — AB (ref 4.0–10.5)

## 2014-07-18 LAB — URINALYSIS, ROUTINE W REFLEX MICROSCOPIC
BILIRUBIN URINE: NEGATIVE
GLUCOSE, UA: NEGATIVE mg/dL
KETONES UR: NEGATIVE mg/dL
Nitrite: POSITIVE — AB
PROTEIN: NEGATIVE mg/dL
Specific Gravity, Urine: 1.007 (ref 1.005–1.030)
Urobilinogen, UA: 0.2 mg/dL (ref 0.0–1.0)
pH: 6.5 (ref 5.0–8.0)

## 2014-07-18 LAB — BLOOD GAS, ARTERIAL
Acid-base deficit: 0.3 mmol/L (ref 0.0–2.0)
Bicarbonate: 23.4 mEq/L (ref 20.0–24.0)
Drawn by: 206361
O2 SAT: 96.4 %
PATIENT TEMPERATURE: 98.6
PO2 ART: 81.6 mmHg (ref 80.0–100.0)
TCO2: 24.4 mmol/L (ref 0–100)
pCO2 arterial: 35.1 mmHg (ref 35.0–45.0)
pH, Arterial: 7.438 (ref 7.350–7.450)

## 2014-07-18 LAB — URINE MICROSCOPIC-ADD ON

## 2014-07-18 LAB — TYPE AND SCREEN
ABO/RH(D): A POS
ANTIBODY SCREEN: NEGATIVE

## 2014-07-18 LAB — SURGICAL PCR SCREEN
MRSA, PCR: NEGATIVE
STAPHYLOCOCCUS AUREUS: NEGATIVE

## 2014-07-18 LAB — PROTIME-INR
INR: 1.02 (ref 0.00–1.49)
Prothrombin Time: 13.6 seconds (ref 11.6–15.2)

## 2014-07-18 LAB — ABO/RH: ABO/RH(D): A POS

## 2014-07-18 MED ORDER — DEXTROSE 5 % IV SOLN
1.5000 g | INTRAVENOUS | Status: AC
  Administered 2014-07-19: 1.5 g via INTRAVENOUS
  Filled 2014-07-18: qty 1.5

## 2014-07-18 MED ORDER — SODIUM CHLORIDE 0.9 % IV SOLN: INTRAVENOUS | Status: DC

## 2014-07-18 MED ORDER — CHLORHEXIDINE GLUCONATE CLOTH 2 % EX PADS: 6.0000 | MEDICATED_PAD | Freq: Once | CUTANEOUS | Status: DC

## 2014-07-18 MED ORDER — CIPROFLOXACIN HCL 500 MG PO TABS: 500.0000 mg | ORAL_TABLET | Freq: Two times a day (BID) | ORAL | Status: DC

## 2014-07-18 NOTE — Pre-Procedure Instructions (Signed)
Gloria Morrison  07/18/2014      [facility] [place], [address] [place] [place] [address] Phone: [phone] Fax: [phone]  [id number] - [place], [address] AT [facility] OF [address] [place] [address] Phone: [phone] Fax: [phone]  [facility] [place], [address] [place] [address] Phone: [phone] Fax: [phone]    Your procedure is scheduled on Wed.June 1 @ 8:30 AM  Report to [facility] and report to Admitting at 6:30 AM  Call this number if you have problems the morning of surgery:  [phone]   Remember:  Do not eat food or drink liquids after midnight.  Take these medicines the morning of surgery with A SIP OF WATER:Ativan(Lorazepam)              Stop taking your CO Q10. No Goody's,BC's,Aleve,Ibuprofen,Fish Oil,or any Herbal Medications.    Do not wear jewelry, make-up or nail polish.  Do not wear lotions, powders, or perfumes.    Do not shave 48 hours prior to surgery.    Do not bring valuables to the hospital.  [facility] is not responsible for any belongings or valuables.  Contacts, dentures or bridgework may not be worn into surgery.  Leave your suitcase in the car.  After surgery it may be brought to your room.  For patients admitted to the hospital, discharge time will be determined by your treatment team.  Patients discharged the day of surgery will not be allowed to drive home.    Special instructions:  [facility] - Preparing for Surgery  Before surgery, you can play an important role.  Because skin is not sterile, your skin needs to be as free of germs as possible.  You can reduce the number of germs on you skin by washing with CHG (chlorahexidine gluconate) soap before surgery.  CHG is an antiseptic cleaner which kills germs and bonds with the skin to  continue killing germs even after washing.  Please DO NOT use if you have an allergy to CHG or antibacterial soaps.  If your skin becomes reddened/irritated stop using the CHG and inform your nurse when you arrive at Short Stay.  Do not shave (including legs and underarms) for at least 48 hours prior to the first CHG shower.  You may shave your face.  Please follow these instructions carefully:   1.  Shower with CHG Soap the night before surgery and the                                morning of Surgery.  2.  If you choose to wash your hair, wash your hair first as usual with your       normal shampoo.  3.  After you shampoo, rinse your hair and body thoroughly to remove the                      Shampoo.  4.  Use CHG as you would any other liquid soap.  You can apply chg directly       to the skin and wash gently with scrungie or a clean washcloth.  5.  Apply the CHG Soap to your body ONLY FROM THE NECK DOWN.  Do not use on open wounds or open sores.  Avoid contact with your eyes,       ears, mouth and genitals (private parts).  Wash genitals (private parts)       with your normal soap.  6.  Wash thoroughly, paying special attention to the area where your surgery        will be performed.  7.  Thoroughly rinse your body with warm water from the neck down.  8.  DO NOT shower/wash with your normal soap after using and rinsing off       the CHG Soap.  9.  Pat yourself dry with a clean towel.            10.  Wear clean pajamas.            11.  Place clean sheets on your bed the night of your first shower and do not        sleep with pets.  Day of Surgery  Do not apply any lotions/deoderants the morning of surgery.  Please wear clean clothes to the hospital/surgery center.    Please read over the following fact sheets that you were given. Pain Booklet, Coughing and Deep Breathing, Blood Transfusion Information, MRSA Information and Surgical Site Infection Prevention

## 2014-07-18 NOTE — Progress Notes (Signed)
Pre-op urinalysis results from today called to Dr. Oneida Alar. Per Dr. Oneida Alar request, Cipro 500mg  by mouth twice a day for 7 days ordered. Dr. Oneida Alar states to keep patient's surgery scheduled for tomorrow. Notified Ms. Wich of Dr. Oneida Alar orders. Patient verbalized understanding.

## 2014-07-18 NOTE — Progress Notes (Addendum)
Anesthesia Chart Review:  Pt is 64 year old female scheduled for abdominal aortic endovascular stent graft on 07/19/2014 with Dr. Oneida Alar.   PMH includes: HTN, hyperlipidemia, AAA, GERD. Pt reports she is "hard to wake up" after anesthesia, does not want versed. Current smoker. BMI 27.  Medications include: ASA, lisinopril  Preoperative labs reviewed.  Many bacteria on microscopic urinalysis; nitrite positive on UA. Notified Stephanie in Dr. Oneida Alar' office.   EKG 07/13/2014: NSR.   Nuclear stress test 07/14/2014:  There was no ST segment deviation noted during stress.  This is a low risk study.  Breast attenuation and nearby radiotracer uptake within the gut limit the study, however there are no obvious defects to suggest scar or ischemia. LVEF 65%.  Pt has cardiac clearance from Dr. Rozann Lesches found in result notes on stress test.   I anticipate UTI may interfere with surgery plans.   Willeen Cass, FNP-BC [facility] Stay Surgical Center/Anesthesiology Phone: [phone] 07/18/2014 1:44 PM

## 2014-07-18 NOTE — Progress Notes (Addendum)
Cardiologist is Dr.Samuel Domenic Polite with last visit 06-2014  Echo unsure if she has ever had this  Stress test report in epic from 07-14-14  EKG in epic from 07-13-14  Denies ever having a heart cath [facility]

## 2014-07-19 ENCOUNTER — Inpatient Hospital Stay (HOSPITAL_COMMUNITY): Payer: 59

## 2014-07-19 ENCOUNTER — Inpatient Hospital Stay (HOSPITAL_COMMUNITY)
Admission: RE | Admit: 2014-07-19 | Discharge: 2014-07-20 | DRG: 269 | Disposition: A | Discharge status: Home / Self Care | Payer: 59 | Source: Ambulatory Visit | Attending: Vascular Surgery | Admitting: Vascular Surgery

## 2014-07-19 ENCOUNTER — Encounter (HOSPITAL_COMMUNITY): Payer: Self-pay | Admitting: Certified Registered Nurse Anesthetist

## 2014-07-19 ENCOUNTER — Inpatient Hospital Stay (HOSPITAL_COMMUNITY): Payer: 59 | Admitting: Certified Registered Nurse Anesthetist

## 2014-07-19 ENCOUNTER — Encounter (HOSPITAL_COMMUNITY)
Admission: RE | Disposition: A | Discharge status: Home / Self Care | Payer: Self-pay | Source: Ambulatory Visit | Attending: Vascular Surgery

## 2014-07-19 ENCOUNTER — Inpatient Hospital Stay (HOSPITAL_COMMUNITY): Payer: 59 | Admitting: Emergency Medicine

## 2014-07-19 DIAGNOSIS — E785 Hyperlipidemia, unspecified: Secondary | ICD-10-CM | POA: Diagnosis present

## 2014-07-19 DIAGNOSIS — Z7982 Long term (current) use of aspirin: Secondary | ICD-10-CM | POA: Diagnosis not present

## 2014-07-19 DIAGNOSIS — I10 Essential (primary) hypertension: Secondary | ICD-10-CM | POA: Diagnosis present

## 2014-07-19 DIAGNOSIS — K219 Gastro-esophageal reflux disease without esophagitis: Secondary | ICD-10-CM | POA: Diagnosis present

## 2014-07-19 DIAGNOSIS — G4700 Insomnia, unspecified: Secondary | ICD-10-CM | POA: Diagnosis present

## 2014-07-19 DIAGNOSIS — Z9889 Other specified postprocedural states: Secondary | ICD-10-CM | POA: Insufficient documentation

## 2014-07-19 DIAGNOSIS — F17210 Nicotine dependence, cigarettes, uncomplicated: Secondary | ICD-10-CM | POA: Diagnosis present

## 2014-07-19 DIAGNOSIS — Z8679 Personal history of other diseases of the circulatory system: Secondary | ICD-10-CM | POA: Insufficient documentation

## 2014-07-19 DIAGNOSIS — N390 Urinary tract infection, site not specified: Secondary | ICD-10-CM | POA: Diagnosis present

## 2014-07-19 DIAGNOSIS — I714 Abdominal aortic aneurysm, without rupture, unspecified: Secondary | ICD-10-CM | POA: Diagnosis present

## 2014-07-19 DIAGNOSIS — Z8249 Family history of ischemic heart disease and other diseases of the circulatory system: Secondary | ICD-10-CM | POA: Diagnosis not present

## 2014-07-19 HISTORY — PX: ABDOMINAL AORTIC ENDOVASCULAR STENT GRAFT: SHX5707

## 2014-07-19 LAB — APTT: aPTT: 33 seconds (ref 24–37)

## 2014-07-19 LAB — BASIC METABOLIC PANEL
ANION GAP: 10 (ref 5–15)
BUN: 6 mg/dL (ref 6–20)
CALCIUM: 8.5 mg/dL — AB (ref 8.9–10.3)
CO2: 24 mmol/L (ref 22–32)
CREATININE: 0.51 mg/dL (ref 0.44–1.00)
Chloride: 101 mmol/L (ref 101–111)
GFR calc Af Amer: >60 mL/min (ref >60)
GFR calc non Af Amer: >60 mL/min (ref >60)
Glucose, Bld: 122 mg/dL — ABNORMAL HIGH (ref 65–99)
Potassium: 4.5 mmol/L (ref 3.5–5.1)
SODIUM: 135 mmol/L (ref 135–145)

## 2014-07-19 LAB — MAGNESIUM: Magnesium: 1.9 mg/dL (ref 1.7–2.4)

## 2014-07-19 LAB — CBC
HCT: 36.2 % (ref 36.0–46.0)
HEMOGLOBIN: 12.2 g/dL (ref 12.0–15.0)
MCH: 30 pg (ref 26.0–34.0)
MCHC: 33.7 g/dL (ref 30.0–36.0)
MCV: 89.2 fL (ref 78.0–100.0)
Platelets: 293 10*3/uL (ref 150–400)
RBC: 4.06 MIL/uL (ref 3.87–5.11)
RDW: 14.4 % (ref 11.5–15.5)
WBC: 9.8 10*3/uL (ref 4.0–10.5)

## 2014-07-19 LAB — PROTIME-INR
INR: 1.07 (ref 0.00–1.49)
Prothrombin Time: 14.1 seconds (ref 11.6–15.2)

## 2014-07-19 SURGERY — INSERTION, ENDOVASCULAR STENT GRAFT, AORTA, ABDOMINAL
Anesthesia: General | Site: Abdomen

## 2014-07-19 MED ORDER — HYDRALAZINE HCL 20 MG/ML IJ SOLN
5.0000 mg | INTRAMUSCULAR | Status: DC | PRN
  Administered 2014-07-20: 5 mg via INTRAVENOUS
  Filled 2014-07-19: qty 1

## 2014-07-19 MED ORDER — ARTIFICIAL TEARS OP OINT
TOPICAL_OINTMENT | OPHTHALMIC | Status: AC
  Filled 2014-07-19: qty 3.5

## 2014-07-19 MED ORDER — PROPOFOL 10 MG/ML IV BOLUS
INTRAVENOUS | Status: AC
  Filled 2014-07-19: qty 20

## 2014-07-19 MED ORDER — PANTOPRAZOLE SODIUM 40 MG PO TBEC
40.0000 mg | DELAYED_RELEASE_TABLET | Freq: Every day | ORAL | Status: DC
  Administered 2014-07-20: 40 mg via ORAL
  Filled 2014-07-19: qty 1

## 2014-07-19 MED ORDER — FENTANYL CITRATE (PF) 100 MCG/2ML IJ SOLN
INTRAMUSCULAR | Status: DC | PRN
  Administered 2014-07-19: 100 ug via INTRAVENOUS
  Administered 2014-07-19 (×3): 50 ug via INTRAVENOUS
  Administered 2014-07-19: 100 ug via INTRAVENOUS
  Administered 2014-07-19: 50 ug via INTRAVENOUS

## 2014-07-19 MED ORDER — SODIUM CHLORIDE 0.9 % IJ SOLN
INTRAMUSCULAR | Status: AC
  Filled 2014-07-19: qty 10

## 2014-07-19 MED ORDER — DEXAMETHASONE SODIUM PHOSPHATE 4 MG/ML IJ SOLN
INTRAMUSCULAR | Status: DC | PRN
  Administered 2014-07-19: 4 mg via INTRAVENOUS

## 2014-07-19 MED ORDER — LACTATED RINGERS IV SOLN
INTRAVENOUS | Status: DC | PRN
  Administered 2014-07-19: 08:00:00 via INTRAVENOUS

## 2014-07-19 MED ORDER — HEPARIN SODIUM (PORCINE) 1000 UNIT/ML IJ SOLN
INTRAMUSCULAR | Status: AC
  Filled 2014-07-19: qty 1

## 2014-07-19 MED ORDER — POTASSIUM CHLORIDE CRYS ER 20 MEQ PO TBCR
20.0000 meq | EXTENDED_RELEASE_TABLET | Freq: Every day | ORAL | Status: DC | PRN
Start: 2014-07-19 — End: 2014-07-20

## 2014-07-19 MED ORDER — EPHEDRINE SULFATE 50 MG/ML IJ SOLN
INTRAMUSCULAR | Status: AC
  Filled 2014-07-19: qty 1

## 2014-07-19 MED ORDER — KETOROLAC TROMETHAMINE 30 MG/ML IJ SOLN
INTRAMUSCULAR | Status: AC
  Filled 2014-07-19: qty 1

## 2014-07-19 MED ORDER — MAGNESIUM SULFATE 2 GM/50ML IV SOLN: 2.0000 g | Freq: Every day | INTRAVENOUS | Status: DC | PRN

## 2014-07-19 MED ORDER — ESTRADIOL 0.1 MG/GM VA CREA: 1.0000 | TOPICAL_CREAM | VAGINAL | Status: DC

## 2014-07-19 MED ORDER — HYDROMORPHONE HCL 1 MG/ML IJ SOLN
INTRAMUSCULAR | Status: AC
  Filled 2014-07-19: qty 1

## 2014-07-19 MED ORDER — DOCUSATE SODIUM 100 MG PO CAPS
100.0000 mg | ORAL_CAPSULE | Freq: Every day | ORAL | Status: DC
  Administered 2014-07-20: 100 mg via ORAL
  Filled 2014-07-19: qty 1

## 2014-07-19 MED ORDER — CIPROFLOXACIN HCL 500 MG PO TABS
500.0000 mg | ORAL_TABLET | Freq: Two times a day (BID) | ORAL | Status: DC
  Administered 2014-07-19 – 2014-07-20 (×2): 500 mg via ORAL
  Filled 2014-07-19 (×4): qty 1

## 2014-07-19 MED ORDER — NEOSTIGMINE METHYLSULFATE 10 MG/10ML IV SOLN
INTRAVENOUS | Status: DC | PRN
  Administered 2014-07-19: 3 mg via INTRAVENOUS

## 2014-07-19 MED ORDER — LORAZEPAM 1 MG PO TABS
1.0000 mg | ORAL_TABLET | Freq: Four times a day (QID) | ORAL | Status: DC | PRN
  Administered 2014-07-20: 1 mg via ORAL
  Filled 2014-07-19: qty 1

## 2014-07-19 MED ORDER — SODIUM CHLORIDE 0.9 % IV SOLN
INTRAVENOUS | Status: DC
Start: 2014-07-19 — End: 2014-07-20
  Administered 2014-07-19: 23:00:00 via INTRAVENOUS

## 2014-07-19 MED ORDER — METOPROLOL TARTRATE 1 MG/ML IV SOLN: 2.0000 mg | INTRAVENOUS | Status: DC | PRN

## 2014-07-19 MED ORDER — PROTAMINE SULFATE 10 MG/ML IV SOLN
INTRAVENOUS | Status: DC | PRN
  Administered 2014-07-19: 70 mg via INTRAVENOUS
  Administered 2014-07-19: 10 mg via INTRAVENOUS

## 2014-07-19 MED ORDER — LABETALOL HCL 5 MG/ML IV SOLN: 10.0000 mg | INTRAVENOUS | Status: DC | PRN

## 2014-07-19 MED ORDER — NEOSTIGMINE METHYLSULFATE 10 MG/10ML IV SOLN
INTRAVENOUS | Status: AC
  Filled 2014-07-19: qty 1

## 2014-07-19 MED ORDER — LABETALOL HCL 5 MG/ML IV SOLN
INTRAVENOUS | Status: AC
  Filled 2014-07-19: qty 4

## 2014-07-19 MED ORDER — HEPARIN SODIUM (PORCINE) 1000 UNIT/ML IJ SOLN
INTRAMUSCULAR | Status: DC | PRN
  Administered 2014-07-19: 8000 [IU] via INTRAVENOUS

## 2014-07-19 MED ORDER — IODIXANOL 320 MG/ML IV SOLN
INTRAVENOUS | Status: DC | PRN
  Administered 2014-07-19: 81.2 mL via INTRAVENOUS

## 2014-07-19 MED ORDER — PHENYLEPHRINE 40 MCG/ML (10ML) SYRINGE FOR IV PUSH (FOR BLOOD PRESSURE SUPPORT)
PREFILLED_SYRINGE | INTRAVENOUS | Status: AC
  Filled 2014-07-19: qty 10

## 2014-07-19 MED ORDER — SENNOSIDES-DOCUSATE SODIUM 8.6-50 MG PO TABS
1.0000 | ORAL_TABLET | Freq: Every evening | ORAL | Status: DC | PRN
  Filled 2014-07-19: qty 1

## 2014-07-19 MED ORDER — GUAIFENESIN-DM 100-10 MG/5ML PO SYRP: 15.0000 mL | ORAL_SOLUTION | ORAL | Status: DC | PRN

## 2014-07-19 MED ORDER — PROMETHAZINE HCL 25 MG/ML IJ SOLN: 6.2500 mg | INTRAMUSCULAR | Status: DC | PRN

## 2014-07-19 MED ORDER — GLYCOPYRROLATE 0.2 MG/ML IJ SOLN
INTRAMUSCULAR | Status: DC | PRN
  Administered 2014-07-19: 0.4 mg via INTRAVENOUS
  Administered 2014-07-19 (×2): 0.2 mg via INTRAVENOUS

## 2014-07-19 MED ORDER — ACETAMINOPHEN 650 MG RE SUPP: 325.0000 mg | RECTAL | Status: DC | PRN

## 2014-07-19 MED ORDER — HYDROCODONE-ACETAMINOPHEN 7.5-325 MG PO TABS: 1.0000 | ORAL_TABLET | Freq: Once | ORAL | Status: DC | PRN

## 2014-07-19 MED ORDER — ASPIRIN EC 81 MG PO TBEC
81.0000 mg | DELAYED_RELEASE_TABLET | Freq: Every day | ORAL | Status: DC
  Administered 2014-07-20: 81 mg via ORAL
  Filled 2014-07-19: qty 1

## 2014-07-19 MED ORDER — BISACODYL 10 MG RE SUPP: 10.0000 mg | Freq: Every day | RECTAL | Status: DC | PRN

## 2014-07-19 MED ORDER — ONDANSETRON HCL 4 MG/2ML IJ SOLN
INTRAMUSCULAR | Status: DC | PRN
  Administered 2014-07-19: 4 mg via INTRAVENOUS

## 2014-07-19 MED ORDER — ONDANSETRON HCL 4 MG/2ML IJ SOLN
INTRAMUSCULAR | Status: AC
  Filled 2014-07-19: qty 2

## 2014-07-19 MED ORDER — MIDAZOLAM HCL 2 MG/2ML IJ SOLN
INTRAMUSCULAR | Status: AC
  Filled 2014-07-19: qty 2

## 2014-07-19 MED ORDER — DEXAMETHASONE SODIUM PHOSPHATE 4 MG/ML IJ SOLN
INTRAMUSCULAR | Status: AC
  Filled 2014-07-19: qty 1

## 2014-07-19 MED ORDER — PROPOFOL 10 MG/ML IV BOLUS
INTRAVENOUS | Status: DC | PRN
  Administered 2014-07-19: 50 mg via INTRAVENOUS

## 2014-07-19 MED ORDER — LABETALOL HCL 5 MG/ML IV SOLN
INTRAVENOUS | Status: DC | PRN
  Administered 2014-07-19: 5 mg via INTRAVENOUS

## 2014-07-19 MED ORDER — MORPHINE SULFATE 2 MG/ML IJ SOLN: 2.0000 mg | INTRAMUSCULAR | Status: DC | PRN

## 2014-07-19 MED ORDER — FENTANYL CITRATE (PF) 250 MCG/5ML IJ SOLN
INTRAMUSCULAR | Status: AC
  Filled 2014-07-19: qty 5

## 2014-07-19 MED ORDER — ALUM & MAG HYDROXIDE-SIMETH 200-200-20 MG/5ML PO SUSP: 15.0000 mL | ORAL | Status: DC | PRN

## 2014-07-19 MED ORDER — ACETAMINOPHEN 325 MG PO TABS
325.0000 mg | ORAL_TABLET | ORAL | Status: DC | PRN
  Administered 2014-07-19: 650 mg via ORAL
  Filled 2014-07-19: qty 2

## 2014-07-19 MED ORDER — ROCURONIUM BROMIDE 100 MG/10ML IV SOLN
INTRAVENOUS | Status: DC | PRN
  Administered 2014-07-19: 40 mg via INTRAVENOUS
  Administered 2014-07-19: 10 mg via INTRAVENOUS

## 2014-07-19 MED ORDER — ONDANSETRON HCL 4 MG/2ML IJ SOLN: 4.0000 mg | Freq: Four times a day (QID) | INTRAMUSCULAR | Status: DC | PRN

## 2014-07-19 MED ORDER — KETOROLAC TROMETHAMINE 30 MG/ML IJ SOLN
30.0000 mg | Freq: Once | INTRAMUSCULAR | Status: AC | PRN
  Administered 2014-07-19: 30 mg via INTRAVENOUS

## 2014-07-19 MED ORDER — LIDOCAINE HCL (CARDIAC) 20 MG/ML IV SOLN
INTRAVENOUS | Status: DC | PRN
  Administered 2014-07-19: 60 mg via INTRAVENOUS

## 2014-07-19 MED ORDER — DEXTROSE 5 % IV SOLN
1.5000 g | Freq: Two times a day (BID) | INTRAVENOUS | Status: AC
  Administered 2014-07-19 – 2014-07-20 (×2): 1.5 g via INTRAVENOUS
  Filled 2014-07-19 (×2): qty 1.5

## 2014-07-19 MED ORDER — ENOXAPARIN SODIUM 30 MG/0.3ML ~~LOC~~ SOLN
30.0000 mg | SUBCUTANEOUS | Status: DC
  Administered 2014-07-20: 30 mg via SUBCUTANEOUS
  Filled 2014-07-19 (×2): qty 0.3

## 2014-07-19 MED ORDER — LIDOCAINE HCL (CARDIAC) 20 MG/ML IV SOLN
INTRAVENOUS | Status: AC
  Filled 2014-07-19: qty 5

## 2014-07-19 MED ORDER — PROTAMINE SULFATE 10 MG/ML IV SOLN
INTRAVENOUS | Status: AC
  Filled 2014-07-19: qty 5

## 2014-07-19 MED ORDER — PHENOL 1.4 % MT LIQD: 1.0000 | OROMUCOSAL | Status: DC | PRN

## 2014-07-19 MED ORDER — ROCURONIUM BROMIDE 50 MG/5ML IV SOLN
INTRAVENOUS | Status: AC
  Filled 2014-07-19: qty 1

## 2014-07-19 MED ORDER — ARTIFICIAL TEARS OP OINT
TOPICAL_OINTMENT | OPHTHALMIC | Status: DC | PRN
  Administered 2014-07-19: 1 via OPHTHALMIC

## 2014-07-19 MED ORDER — LISINOPRIL 5 MG PO TABS
5.0000 mg | ORAL_TABLET | Freq: Every day | ORAL | Status: DC
  Administered 2014-07-19 – 2014-07-20 (×2): 5 mg via ORAL
  Filled 2014-07-19 (×2): qty 1

## 2014-07-19 MED ORDER — SODIUM CHLORIDE 0.9 % IV SOLN: 500.0000 mL | Freq: Once | INTRAVENOUS | Status: AC | PRN

## 2014-07-19 MED ORDER — SODIUM CHLORIDE 0.9 % IR SOLN
Status: DC | PRN
  Administered 2014-07-19: 10:00:00

## 2014-07-19 MED ORDER — HYDROCODONE-ACETAMINOPHEN 7.5-325 MG PO TABS
ORAL_TABLET | ORAL | Status: AC
  Filled 2014-07-19: qty 1

## 2014-07-19 MED ORDER — HYDROMORPHONE HCL 1 MG/ML IJ SOLN: 0.2500 mg | INTRAMUSCULAR | Status: DC | PRN

## 2014-07-19 MED ORDER — SUCCINYLCHOLINE CHLORIDE 20 MG/ML IJ SOLN
INTRAMUSCULAR | Status: AC
  Filled 2014-07-19: qty 1

## 2014-07-19 MED ORDER — OXYCODONE-ACETAMINOPHEN 5-325 MG PO TABS
1.0000 | ORAL_TABLET | ORAL | Status: DC | PRN
  Administered 2014-07-20: 1 via ORAL
  Filled 2014-07-19: qty 1

## 2014-07-19 MED ORDER — GLYCOPYRROLATE 0.2 MG/ML IJ SOLN
INTRAMUSCULAR | Status: AC
  Filled 2014-07-19: qty 3

## 2014-07-19 MED ORDER — DEXTROSE 5 % IV SOLN
10.0000 mg | INTRAVENOUS | Status: DC | PRN
  Administered 2014-07-19: 25 ug/min via INTRAVENOUS

## 2014-07-19 MED ORDER — 0.9 % SODIUM CHLORIDE (POUR BTL) OPTIME
TOPICAL | Status: DC | PRN
  Administered 2014-07-19: 1000 mL

## 2014-07-19 MED ORDER — EPHEDRINE SULFATE 50 MG/ML IJ SOLN
INTRAMUSCULAR | Status: DC | PRN
  Administered 2014-07-19 (×2): 5 mg via INTRAVENOUS

## 2014-07-19 SURGICAL SUPPLY — 46 items
CANISTER SUCTION 2500CC (MISCELLANEOUS) ×2 IMPLANT
CATH ANGIO 5F BER2 65CM (CATHETERS) ×2 IMPLANT
CATH BEACON 5.038 65CM KMP-01 (CATHETERS) IMPLANT
CATH OMNI FLUSH .035X70CM (CATHETERS) ×2 IMPLANT
COVER PROBE W GEL 5X96 (DRAPES) ×2 IMPLANT
DEVICE CLOSURE PERCLS PRGLD 6F (VASCULAR PRODUCTS) ×6 IMPLANT
DRSG TEGADERM 2-3/8X2-3/4 SM (GAUZE/BANDAGES/DRESSINGS) ×4 IMPLANT
DRYSEAL FLEXSHEATH 12FR 33CM (SHEATH) ×2
ELECT REM PT RETURN 9FT ADLT (ELECTROSURGICAL) ×4
ELECTRODE REM PT RTRN 9FT ADLT (ELECTROSURGICAL) ×2 IMPLANT
EXCLUDER TNK 28.5X14.5X18CM (Endovascular Graft) ×1 IMPLANT
EXCLUDER TRUNK 28.5X14.5X18CM (Endovascular Graft) ×2 IMPLANT
GAUZE SPONGE 2X2 8PLY STRL LF (GAUZE/BANDAGES/DRESSINGS) ×1 IMPLANT
GLOVE BIO SURGEON STRL SZ7.5 (GLOVE) ×2 IMPLANT
GOWN STRL REUS W/ TWL LRG LVL3 (GOWN DISPOSABLE) ×4 IMPLANT
GOWN STRL REUS W/TWL LRG LVL3 (GOWN DISPOSABLE) ×4
GRAFT BALLN CATH 65CM (STENTS) ×1 IMPLANT
GRAFT EXCLUDER AORTIC 28X3.3CM (Endovascular Graft) ×2 IMPLANT
GRAFT EXCLUDER LEG 14.5X14 (Endovascular Graft) ×2 IMPLANT
KIT BASIN OR (CUSTOM PROCEDURE TRAY) ×2 IMPLANT
KIT ROOM TURNOVER OR (KITS) ×2 IMPLANT
LIQUID BAND (GAUZE/BANDAGES/DRESSINGS) ×2 IMPLANT
NEEDLE PERC 18GX7CM (NEEDLE) ×2 IMPLANT
NS IRRIG 1000ML POUR BTL (IV SOLUTION) ×2 IMPLANT
PACK ENDOVASCULAR (PACKS) ×2 IMPLANT
PAD ARMBOARD 7.5X6 YLW CONV (MISCELLANEOUS) ×4 IMPLANT
PERCLOSE PROGLIDE 6F (VASCULAR PRODUCTS) ×12
SHEATH AVANTI 11CM 8FR (MISCELLANEOUS) ×2 IMPLANT
SHEATH BRITE TIP 8FR 23CM (MISCELLANEOUS) ×2 IMPLANT
SHEATH DRYSEAL FLEX 12FR 33CM (SHEATH) ×2 IMPLANT
SPONGE GAUZE 2X2 STER 10/PKG (GAUZE/BANDAGES/DRESSINGS) ×1
SPONGE SURGIFOAM ABS GEL 100 (HEMOSTASIS) IMPLANT
STAPLER VISISTAT 35W (STAPLE) IMPLANT
STENT GRAFT BALLN CATH 65CM (STENTS) ×1
STOPCOCK MORSE 400PSI 3WAY (MISCELLANEOUS) ×2 IMPLANT
SUT PROLENE 5 0 C 1 24 (SUTURE) IMPLANT
SUT VIC AB 2-0 CT1 27 (SUTURE)
SUT VIC AB 2-0 CT1 TAPERPNT 27 (SUTURE) IMPLANT
SUT VIC AB 3-0 SH 27 (SUTURE) ×2
SUT VIC AB 3-0 SH 27X BRD (SUTURE) ×2 IMPLANT
SUT VICRYL 4-0 PS2 18IN ABS (SUTURE) ×4 IMPLANT
SYR 30ML LL (SYRINGE) ×2 IMPLANT
TRAY FOLEY CATH 16FRSI W/METER (SET/KITS/TRAYS/PACK) ×2 IMPLANT
TUBING HIGH PRESSURE 120CM (CONNECTOR) ×2 IMPLANT
WIRE AMPLATZ SS-J .035X180CM (WIRE) ×4 IMPLANT
WIRE BENTSON .035X145CM (WIRE) ×4 IMPLANT

## 2014-07-19 NOTE — Anesthesia Preprocedure Evaluation (Addendum)
Anesthesia Evaluation  Patient identified by MRN, date of birth, ID band Patient awake    Reviewed: Allergy & Precautions, NPO status , Patient's Chart, lab work & pertinent test results  History of Anesthesia Complications (+) PROLONGED EMERGENCE  Airway Mallampati: II  TM Distance: >3 FB Neck ROM: Full    Dental   Pulmonary Current Smoker,  breath sounds clear to auscultation        Cardiovascular hypertension, Pt. on medications + Peripheral Vascular Disease Rhythm:Regular Rate:Normal  Nuclear stress test 07/14/2014:There was no ST segment deviation noted during stress.This is a low risk study. Breast attenuation and nearby radiotracer uptake within the gut limit the study, however there are no obvious defects to suggest scar or ischemia. LVEF 65%.   Neuro/Psych negative neurological ROS     GI/Hepatic Neg liver ROS, GERD-  ,  Endo/Other  negative endocrine ROS  Renal/GU negative Renal ROS     Musculoskeletal   Abdominal   Peds  Hematology negative hematology ROS (+)   Anesthesia Other Findings   Reproductive/Obstetrics                            Lab Results  Component Value Date   WBC 11.3* 07/18/2014   HGB 13.3 07/18/2014   HCT 38.8 07/18/2014   MCV 88.2 07/18/2014   PLT 340 07/18/2014   Lab Results  Component Value Date   CREATININE 0.48 07/18/2014   BUN 19 07/18/2014   NA 132* 07/18/2014   K 4.2 07/18/2014   CL 97* 07/18/2014   CO2 22 07/18/2014    Anesthesia Physical Anesthesia Plan  ASA: III  Anesthesia Plan: General   Post-op Pain Management:    Induction: Intravenous  Airway Management Planned: Oral ETT  Additional Equipment: Arterial line  Intra-op Plan:   Post-operative Plan: Extubation in OR  Informed Consent: I have reviewed the patients History and Physical, chart, labs and discussed the procedure including the risks, benefits and alternatives for  the proposed anesthesia with the patient or authorized representative who has indicated his/her understanding and acceptance.   Dental advisory given  Plan Discussed with: CRNA  Anesthesia Plan Comments:         Anesthesia Quick Evaluation

## 2014-07-19 NOTE — Anesthesia Procedure Notes (Signed)
Procedure Name: Intubation Date/Time: 07/19/2014 8:42 AM Performed by: Rogers Blocker Pre-anesthesia Checklist: Patient identified, Emergency Drugs available, Suction available, Patient being monitored and Timeout performed Patient Re-evaluated:Patient Re-evaluated prior to inductionOxygen Delivery Method: Circle system utilized Preoxygenation: Pre-oxygenation with 100% oxygen Intubation Type: IV induction Ventilation: Mask ventilation without difficulty and Oral airway inserted - appropriate to patient size Laryngoscope Size: Mac and 3 Grade View: Grade I Tube type: Oral Number of attempts: 1 Airway Equipment and Method: Stylet Placement Confirmation: ETT inserted through vocal cords under direct vision Secured at: 22 cm Tube secured with: Tape Dental Injury: Teeth and Oropharynx as per pre-operative assessment

## 2014-07-19 NOTE — Transfer of Care (Signed)
Immediate Anesthesia Transfer of Care Note  Patient: Gloria Morrison  Procedure(s) Performed: Procedure(s): ABDOMINAL AORTIC ENDOVASCULAR STENT GRAFT (N/A)  Patient Location: PACU  Anesthesia Type:General  Level of Consciousness: awake, alert , oriented and patient cooperative  Airway & Oxygen Therapy: Patient Spontanous Breathing and Patient connected to face mask oxygen  Post-op Assessment: Report given to RN, Post -op Vital signs reviewed and stable and Patient moving all extremities  Post vital signs: Reviewed and stable  Last Vitals:  Filed Vitals:   07/19/14 1103  BP:   Pulse:   Temp: 36.5 C  Resp:     Complications: No apparent anesthesia complications

## 2014-07-19 NOTE — Progress Notes (Signed)
Pt arrived from PACU, VSS, oriented to unit and routine, call bell within reach. CMT and elink notified.  Complaints of foley cath pain request removal. Notified Gerri Lins, PA and received order to remove.  No other complaints of pain at this time. Surgical sites soft, pulses palpable.   Will continue to monitor for changes.

## 2014-07-19 NOTE — Anesthesia Postprocedure Evaluation (Signed)
  Anesthesia Post-op Note  Patient: Gloria Morrison  Procedure(s) Performed: Procedure(s): ABDOMINAL AORTIC ENDOVASCULAR STENT GRAFT (N/A)  Patient Location: PACU  Anesthesia Type:General  Level of Consciousness: awake, alert  and oriented  Airway and Oxygen Therapy: Patient Spontanous Breathing  Post-op Pain: none  Post-op Assessment: Post-op Vital signs reviewed  Post-op Vital Signs: Reviewed  Last Vitals:  Filed Vitals:   07/19/14 1400  BP:   Pulse: 53  Temp:   Resp: 14    Complications: No apparent anesthesia complications

## 2014-07-19 NOTE — H&P (View-Only) (Signed)
[facility] HISTORY AND PHYSICAL   History of Present Illness:  Patient is a 64 y.o. year old female who presents for evaluation of abdominal aortic aneurysm.  Patient was noted recently to have a 7 cm infrarenal abdominal aortic aneurysm on a CT scan done for evaluation of hematuria. The patient has known she had an aneurysm for approximately 8 years. She has had intermittent surveillance scans of this but admits that she had missed several scans in the last few years. She denies severe abdominal or back pain. She is a smoker of approximately one half pack per day. She is trying to quit. Other medical problems include hyperlipidemia, hypertension, microscopic hematuria no further workup planned. All of these current medical problems are stable.  Past Medical History  Diagnosis Date  . Hyperlipidemia   . Insomnia   . Postmenopausal atrophic vaginitis   . Bronchitis   . Hypertension   . Hematuria     Past Surgical History  Procedure Laterality Date  . Breast fibroadenoma surgery Left 1986  . Tonsillectomy      done as a child    Social History History  Substance Use Topics  . Smoking status: Current Every Day Smoker -- 0.25 packs/day  . Smokeless tobacco: Never Used  . Alcohol Use: 3.0 oz/week    5 Cans of beer per week    Family History Family History  Problem Relation Age of Onset  . Cancer Father     Melanoma  . Hypertension Sister     Allergies  No Known Allergies   Current Outpatient Prescriptions  Medication Sig Dispense Refill  . aspirin EC 81 MG tablet Take 81 mg by mouth.    . calcium carbonate 1250 MG capsule Take 1,250 mg by mouth.    . Coenzyme Q10 (CO Q 10) 10 MG CAPS Take 100 mg by mouth.    . estradiol (ESTRACE) 0.1 MG/GM vaginal cream Place vaginally.    Marland Kitchen lisinopril (PRINIVIL,ZESTRIL) 5 MG tablet Take 5 mg by mouth daily.     Marland Kitchen LORazepam (ATIVAN) 1 MG tablet Take 1 mg by mouth every 8 (eight) hours as needed.     .  Multiple Vitamins-Minerals (ONE DAILY PLUS MINERALS) TABS Take by mouth.     No current facility-administered medications for this visit.    ROS:   General:  No weight loss, Fever, chills  HEENT: No recent headaches, no nasal bleeding, no visual changes, no sore throat  Neurologic: No dizziness, blackouts, seizures. No recent symptoms of stroke or mini- stroke. No recent episodes of slurred speech, or temporary blindness.  Cardiac: No recent episodes of chest pain/pressure, no shortness of breath at rest.  No shortness of breath with exertion.  Denies history of atrial fibrillation or irregular heartbeat  Vascular: No history of rest pain in feet.  No history of claudication.  No history of non-healing ulcer, No history of DVT   Pulmonary: No home oxygen, no productive cough, no hemoptysis,  No asthma or wheezing  Musculoskeletal:  [ ]  Arthritis, [ ]  Low back pain,  [ ]  Joint pain  Hematologic:No history of hypercoagulable state.  No history of easy bleeding.  No history of anemia  Gastrointestinal: No hematochezia or melena,  No gastroesophageal reflux, no trouble swallowing  Urinary: [ ]  chronic Kidney disease, [ ]  on HD - [ ]  MWF or [ ]  TTHS, [ ]  Burning with urination, [ ]  Frequent urination, [ ]  Difficulty urinating;   Skin: No  rashes  Psychological: No history of anxiety,  No history of depression   Physical Examination  Filed Vitals:   07/06/14 1134  BP: 125/80  Pulse: 82  Temp: 98 F (36.7 C)  TempSrc: Oral  Resp: 18  Height: 5' 5.5" (1.664 m)  Weight: 168 lb 6.4 oz (76.386 kg)  SpO2: 98%    Body mass index is 27.59 kg/(m^2).  General:  Alert and oriented, no acute distress HEENT: Normal Neck: No bruit or JVD Pulmonary: Clear to auscultation bilaterally Cardiac: Regular Rate and Rhythm without murmur Abdomen: Soft, non-tender, non-distended, no mass, no scars Skin: No rash Extremity Pulses:  2+ radial, brachial, femoral, dorsalis pedis, posterior tibial  pulses bilaterally Musculoskeletal: No deformity or edema  Neurologic: Upper and lower extremity motor 5/5 and symmetric  DATA:  CT scan of the abdomen and pelvis a 5 mm cuts is reviewed today. This shows a 7 cm abdominal aortic aneurysm with some tortuosity of the iliacs but no aneurysmal dilatation of the iliac arteries. The infrarenal neck seems suitable for stent graft repair but will need a fine cut CT to further determine this   ASSESSMENT:  7 cm abdominal aortic aneurysm at risk of rupture.  PLAN:  The patient will undergo cardiac risk stratification. I have tentatively scheduled her for repair of her aneurysm on June 1 with a Gore Excluder stent graft. She will have a repeat CT scan with thin cuts for further measurement and planning for her aneurysm stent graft. She will try to quit smoking.  Ruta Hinds, MD [facility] [place] [facility] Office: [phone] Pager: [phone]

## 2014-07-19 NOTE — Interval H&P Note (Signed)
History and Physical Interval Note:  07/19/2014 8:07 AM  Gloria Morrison  has presented today for surgery, with the diagnosis of Abdominal aortic aneurysm I71.4  The various methods of treatment have been discussed with the patient and family. After consideration of risks, benefits and other options for treatment, the patient has consented to  Procedure(s): ABDOMINAL AORTIC ENDOVASCULAR STENT GRAFT (N/A) as a surgical intervention .  The patient's history has been reviewed, patient examined, no change in status, stable for surgery.  I have reviewed the patient's chart and labs.  Questions were answered to the patient's satisfaction.     Ruta Hinds

## 2014-07-19 NOTE — Progress Notes (Signed)
      Alert and sitting up in chair  Palpable DP bilaterally 2+ Groin sites soft without hematoma   S/P EVAR Transfer to 3S Aurilla Coulibaly MAUREEN PA-C

## 2014-07-19 NOTE — Op Note (Addendum)
Procedure: AAA stent graft repair of infrarenal AAA. (Gore Excluder)  Preop: AAA Postop: same Anesthesia: General Asst: Gerri Lins, PA-C  Operative findings:   #1 Bilateral Proglide closure   #2 28x14x18 cm main body Gore Excluder device delivered via a right femoral system   #3 14 x 14 cm left iliac limb contralateral   PROCEDURE DETAIL: After obtaining informed consent the patient was taken to the operating room. The patient was placed in supine position on the operating room table. After induction of general anesthesia and endotracheal intubation a Foley catheter was placed. Next the patient was prepped and draped in usual sterile fashion from the nipples down to the knees. Ultrasound was used to identify the right common femoral artery as well as the femoral bifurcation. An 11 blade was used to make a small neck in the skin over the level of the right common femoral artery. An introducer needle was then used to cannulate the right common femoral artery without difficulty. A 0.035 Bentson wire was then threaded up into the abdominal aorta through the right femoral system. A short 9 French dilator was placed over the guidewire the right femoral system. This was used to dilate the tract. The dilator was then removed and a Proglide device inserted over the guidewire into the right femoral system and this was deployed at the 2:00 position. The Proglide device was then removed and an additional Proglide device was brought in operative field and deployed at the 10:00 position. The sutures were kept separate and tagged with suture tags. Next the short 9 French sheath was then placed back over the guidewire into the right femoral system and the dilator removed and the sheath thoroughly flushed with heparinized saline. Attention was then turned to the left groin. Again using ultrasound the left common femoral artery was identified. The femoral bifurcation was also identified. A small nick was made in  the skin with the 11 blade. A hemostat was used to dilate a tract down to the artery. An introducer needle was then used to cannulate the left common femoral artery without difficulty. A 0.035 Bentson wire was then threaded up into the abdominal aorta under fluoroscopic guidance. A 9 French dilator was then placed over the wire to dilate the tract. Two Proglide devices were again brought on operative field and these were fired sequentially in the 10:00 position followed by an additional Proglide at the 2:00 position. The Proglide delivery systems were removed and the long 9 French sheath placed back over the guidewire up to the level of the iliac of the aortic bifurcation.  At this point, a 5 [place] Omni flush catheter was placed over the guidewire and the left groin up into the abdominal aorta. An abdominal aortogram was obtained in a 15 degree cranial 15 degree LAO position to determine level of the left and right renal arteries. The patient was given 8000 units of heparin.  At this point a 28 x 14 x 18 Gore Excluder main body device was selected. The Bentson wire from the right groin was advanced up into the descending thoracic aorta over a Berenstein 2 catheter and the Bentsen wire replaced with an 035 Amplatz wire. An 035 amplatz wire was placed up the right femoral system and an 18 Fr dryseal sheath placed over this.The main body device was then placed over the Amplatz wire in the right groin and advanced up to the level of the renal arteries.  The top portion of the stent graft was then  deployed with the end of the stent just below the level of the left renal artery and this came over to just below the level of the right renal artery. The main body was delivered all the way down to the contralateral gate. Magnified views of the renal arteries were performed to make sure that we were not covering these.  Attention was then turned to the left groin. The Omni flush catheter was pulled down over an 035 amplatz  guidewire and removed and using the Bentson wire as a buddy wire, it was placed in position to cannulate the contralateral gate. The berenstein 2 catheter was placed back over the guidewire and this was used to selectively catheterize the contralateral gate and the guidewire was then advanced into the descending thoracic aorta. The main body portion of the gate was confirmed by twirling the omniflush catheter within the device. The omniflush  catheter was then placed in a location so that we could use its markers to determine the exact length to the iliac bifurcation. An Amplatz wire was placed back through the pigtail catheter. A retrograde contrast angiogram was performed to determine the level of the left internal iliac artery and a 14 x 14 cm iliac limb was selected. The pigtail catheter was removed over the guidewire and the 14 x 14 cm limb advanced so there was full coverage of the long marker on the contralateral limb. This was then deployed in the usual fashion down to the iliac bifurcation. The delivery system was then removed. The remainder of the ipsilateral iliac limb was also deployed.   Attention was then turned back to the left iliac system and a Gore aortic balloon placed over the wire up to the level of the proximal end of the stent and this was ballooned to profile. The limb attachment site was also ballooned as well as the distal attachment site. Attention was then turned to the right groin and the balloon was advanced over the guidewire on the right side and the distal attachment site as well as the midportion of iliac limb were also ballooned. The 5 [place] Omni flush catheter was then placed back to the guidewire on the right side and a completion arteriogram was obtained. This showed a proximal type 1 leak adjacent to the right renal artery.  This persisted after reballooning so a proximal cuff was brought on the field to reinforce the top end of the graft as this could not be extended without  covering the renals.  A 28 x 3.3 proximal cuff was deployed with flush overlap of the distal end and again ballooned with the Gore balloon.  This showed no evidence of proximal or distal type I endoleak. 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   At this point the Omni flush catheter was removed over a guidewire. All delivery devices were removed. We then proceeded to remove the large 18 French sheath from the right side with the guidewire in place. With pressure held above and below the insertion site the lateral Proglide closure was secured down.  An additional AP Proglide was then placed over the guidewire and deployed.  There was good hemostasis. The guidewire was removed from the right side. Attention was then turned to the left groin. In similar fashion the 12 French sheath was removed and the guidewire left in place.  The lateral Proglide was secured and 1 additional Proglides were deployed in the AP position over the wire to obtain hemostasis.  There was good hemostasis and the Bentsen wire was removed from the left groin. The patient had been given 8000 units of heparin before introducing the main body device. This was fully reversed with 80 mg of protamine at the end of the case after confirming doppler flow to the feet.  Each groin puncture site was then closed with a running 4-0 Vicryl subcuticular stitch.  The patient tolerated procedure well and there were no complications. Instrument sponge and needle counts correct in the case. Patient was awakened in the operating room extubated and taken to the recovery room in stable condition.  Ruta Hinds, MD [facility] [place] Office: [phone] Pager: [phone]

## 2014-07-20 ENCOUNTER — Encounter (HOSPITAL_COMMUNITY): Payer: Self-pay | Admitting: Vascular Surgery

## 2014-07-20 ENCOUNTER — Telehealth: Payer: Self-pay | Admitting: Vascular Surgery

## 2014-07-20 ENCOUNTER — Other Ambulatory Visit: Payer: Self-pay | Admitting: *Deleted

## 2014-07-20 DIAGNOSIS — I714 Abdominal aortic aneurysm, without rupture, unspecified: Secondary | ICD-10-CM

## 2014-07-20 DIAGNOSIS — Z48812 Encounter for surgical aftercare following surgery on the circulatory system: Secondary | ICD-10-CM

## 2014-07-20 LAB — BASIC METABOLIC PANEL
Anion gap: 12 (ref 5–15)
BUN: 6 mg/dL (ref 6–20)
CALCIUM: 8.7 mg/dL — AB (ref 8.9–10.3)
CO2: 24 mmol/L (ref 22–32)
CREATININE: 0.56 mg/dL (ref 0.44–1.00)
Chloride: 95 mmol/L — ABNORMAL LOW (ref 101–111)
GFR calc Af Amer: >60 mL/min (ref >60)
Glucose, Bld: 97 mg/dL (ref 65–99)
Potassium: 3.5 mmol/L (ref 3.5–5.1)
SODIUM: 131 mmol/L — AB (ref 135–145)

## 2014-07-20 LAB — CBC
HEMATOCRIT: 33.7 % — AB (ref 36.0–46.0)
Hemoglobin: 11.4 g/dL — ABNORMAL LOW (ref 12.0–15.0)
MCH: 30 pg (ref 26.0–34.0)
MCHC: 33.8 g/dL (ref 30.0–36.0)
MCV: 88.7 fL (ref 78.0–100.0)
Platelets: 311 10*3/uL (ref 150–400)
RBC: 3.8 MIL/uL — ABNORMAL LOW (ref 3.87–5.11)
RDW: 14.4 % (ref 11.5–15.5)
WBC: 12.9 10*3/uL — ABNORMAL HIGH (ref 4.0–10.5)

## 2014-07-20 MED ORDER — TRAMADOL HCL 50 MG PO TABS: 50.0000 mg | ORAL_TABLET | Freq: Four times a day (QID) | ORAL | Status: DC | PRN

## 2014-07-20 NOTE — Progress Notes (Signed)
Discussed discharge instructions and medications with patient and family. Both verbalized understanding with all questions answered. VSS. Pt discharged home with family.  Dura Mccormack,RN  

## 2014-07-20 NOTE — Discharge Summary (Signed)
EVAR Discharge Summary   Gloria Morrison 06/15/1950 64 y.o. female  MRN: [id number]  Admission Date: 07/19/2014  Discharge Date: 07/20/14  Physician: Elam Dutch, MD  Admission Diagnosis: Abdominal aortic aneurysm I71.4   HPI:   This is a 64 y.o. female who presents for evaluation of abdominal aortic aneurysm. Patient was noted recently to have a 7 cm infrarenal abdominal aortic aneurysm on a CT scan done for evaluation of hematuria. The patient has known she had an aneurysm for approximately 8 years. She has had intermittent surveillance scans of this but admits that she had missed several scans in the last few years. She denies severe abdominal or back pain. She is a smoker of approximately one half pack per day. She is trying to quit. Other medical problems include hyperlipidemia, hypertension, microscopic hematuria no further workup planned. All of these current medical problems are stable.  Hospital Course:  The patient was admitted to the hospital and taken to the operating room on 07/19/2014 and underwent: AAA stent graft repair of infrarenal AAA. (Gore Excluder)    The pt tolerated the procedure well and was transported to the PACU in good condition.   Pt did have a positive UTI prior to surgery and was started on Cipro.  The pt is told to finish the remainder of the prescription.    She did not want OxyIR and was given an Rx for Tramadol.    The remainder of the hospital course consisted of increasing mobilization and increasing intake of solids without difficulty.  CBC    Component Value Date/Time   WBC 12.9* 07/20/2014 0520   RBC 3.80* 07/20/2014 0520   HGB 11.4* 07/20/2014 0520   HCT 33.7* 07/20/2014 0520   PLT 311 07/20/2014 0520   MCV 88.7 07/20/2014 0520   MCH 30.0 07/20/2014 0520   MCHC 33.8 07/20/2014 0520   RDW 14.4 07/20/2014 0520    BMET    Component Value Date/Time   NA 131* 07/20/2014 0520   K 3.5 07/20/2014 0520   CL 95* 07/20/2014 0520     CO2 24 07/20/2014 0520   GLUCOSE 97 07/20/2014 0520   BUN 6 07/20/2014 0520   CREATININE 0.56 07/20/2014 0520   CALCIUM 8.7* 07/20/2014 0520   GFRNONAA >60 07/20/2014 0520   GFRAA >60 07/20/2014 0520       Discharge Instructions    ABDOMINAL PROCEDURE/ANEURYSM REPAIR/AORTO-BIFEMORAL BYPASS:  Call MD for increased abdominal pain; cramping diarrhea; nausea/vomiting    Complete by:  As directed      Call MD for:  redness, tenderness, or signs of infection (pain, swelling, bleeding, redness, odor or green/yellow discharge around incision site)    Complete by:  As directed      Call MD for:  severe or increased pain, loss or decreased feeling  in affected limb(s)    Complete by:  As directed      Call MD for:  temperature >100.5    Complete by:  As directed      Discharge wound care:    Complete by:  As directed   Shower daily with soap and water starting 07/21/14     Driving Restrictions    Complete by:  As directed   No driving for 2 weeks     Lifting restrictions    Complete by:  As directed   No lifting for 4 weeks     Resume previous diet    Complete by:  As directed  Discharge Diagnosis:  Abdominal aortic aneurysm I71.4  Secondary Diagnosis: Patient Active Problem List   Diagnosis Date Noted  . AAA (abdominal aortic aneurysm) 07/19/2014  . AAA (abdominal aortic aneurysm) without rupture 07/06/2014   Past Medical History  Diagnosis Date  . Hyperlipidemia     borderline;was on meds but has been off x 12 yrs   . AAA (abdominal aortic aneurysm) without rupture 07/06/2014  . Essential hypertension     takes Lisinopril daily  . Complication of anesthesia     hard to wake up (pt does not want Versed)  . History of bronchitis     a yr ago  . URI (upper respiratory infection)     Jan 2016  . Cough     allergies  . Headache     occasionally  . GERD (gastroesophageal reflux disease)     once in a while-will take OTC meds if needed  . Microscopic  hematuria   . Insomnia     takes Lorazepam as needed       Medication List    TAKE these medications        aspirin EC 81 MG tablet  Take 81 mg by mouth daily.     calcium carbonate 1250 MG capsule  Take 1,250 mg by mouth.     ciprofloxacin 500 MG tablet  Commonly known as:  CIPRO  Take 1 tablet (500 mg total) by mouth 2 (two) times daily. For 7 days     Co Q 10 10 MG Caps  Take 100 mg by mouth.     estradiol 0.1 MG/GM vaginal cream  Commonly known as:  ESTRACE  Place 1 Applicatorful vaginally once a week.     lisinopril 5 MG tablet  Commonly known as:  PRINIVIL,ZESTRIL  Take 5 mg by mouth daily.     LORazepam 1 MG tablet  Commonly known as:  ATIVAN  Take 1 mg by mouth every 8 (eight) hours as needed.     ONE DAILY PLUS MINERALS Tabs  Take 1 tablet by mouth.     traMADol 50 MG tablet  Commonly known as:  ULTRAM  Take 1 tablet (50 mg total) by mouth every 6 (six) hours as needed.        Prescriptions given: Tramadol#20 No Refill  Instructions: 1.  Finish out Cipro as instructed  Disposition: home  Patient's condition: is Good  Follow up: 1. Dr. Oneida Alar in 4 weeks with CTA   Leontine Locket, PA-C [facility] [phone] 07/20/2014  7:35 AM   - For VQI Registry use --- Instructions: Press F2 to tab through selections.  Delete question if not applicable.   Post-op:  Time to Extubation: [x]  In OR, [ ]  < 12 hrs, [ ]  12-24 hrs, [ ]  >=24 hrs Vasopressors Req. Post-op: No MI: No., [ ]  Troponin only, [ ]  EKG or Clinical New Arrhythmia: No CHF: No ICU Stay: 1 day in stepdown Transfusion: No  If yes, n/a units given  Complications: Resp failure: No., [ ]  Pneumonia, [ ]  Ventilator Chg in renal function: No., [ ]  Inc. Cr > 0.5, [ ]  Temp. Dialysis, [ ]  Permanent dialysis Leg ischemia: No., no Surgery needed, [ ]  Yes, Surgery needed, [ ]  Amputation Bowel ischemia: No., [ ]  Medical Rx, [ ]  Surgical Rx Wound complication: No., [ ]   Superficial separation/infection, [ ]  Return to OR Return to OR: No  Return to OR for bleeding: No Stroke: No., [ ]  Minor, [ ]   Major  Discharge medications: Statin use:  No If No: [ x] For Medical reasons, [ ]  Non-compliant, [ ]  Not-indicated ASA use:  Yes  If No: [ ]  For Medical reasons, [ ]  Non-compliant, [ ]  Not-indicated Plavix use:  No If No: [ ]  For Medical reasons, [ ]  Non-compliant, [ ]  Not-indicated Beta blocker use:  No If No: [ ]  For Medical reasons, [ ]  Non-compliant, [ ]  Not-indicated ACEI use: yes

## 2014-07-20 NOTE — Progress Notes (Addendum)
  Progress Note    07/20/2014 7:21 AM 1 Day Post-Op  Subjective:  C/o soreness in her groins and not getting sleep and wanting to go home to sleep.  Afebrile 536'I-680'H systolic HR  21'Y-24'M NSR 97% RA  Filed Vitals:   07/20/14 0400  BP:   Pulse:   Temp: 98 F (36.7 C)  Resp:     Physical Exam: Cardiac:  regular Lungs:  Non labored Incisions:  Bilateral  Extremities:  3+ palpable DP pulses bilaterally Abdomen:  Soft, NT/ND  CBC    Component Value Date/Time   WBC 12.9* 07/20/2014 0520   RBC 3.80* 07/20/2014 0520   HGB 11.4* 07/20/2014 0520   HCT 33.7* 07/20/2014 0520   PLT 311 07/20/2014 0520   MCV 88.7 07/20/2014 0520   MCH 30.0 07/20/2014 0520   MCHC 33.8 07/20/2014 0520   RDW 14.4 07/20/2014 0520    BMET    Component Value Date/Time   NA 131* 07/20/2014 0520   K 3.5 07/20/2014 0520   CL 95* 07/20/2014 0520   CO2 24 07/20/2014 0520   GLUCOSE 97 07/20/2014 0520   BUN 6 07/20/2014 0520   CREATININE 0.56 07/20/2014 0520   CALCIUM 8.7* 07/20/2014 0520   GFRNONAA >60 07/20/2014 0520   GFRAA >60 07/20/2014 0520    INR    Component Value Date/Time   INR 1.07 07/19/2014 1120     Intake/Output Summary (Last 24 hours) at 07/20/14 0721 Last data filed at 07/19/14 2303  Gross per 24 hour  Intake   2890 ml  Output   1850 ml  Net   1040 ml     Assessment:  64 y.o. female is s/p:  AAA stent graft repair of infrarenal AAA. (Gore Excluder)  1 Day Post-Op  Plan: -pt doing well this am with easily palpable DP pulses bilaterally -DVT prophylaxis:  Lovenox -renal function okay this morning -discharge home-f/u with Dr. Oneida Alar in 4 weeks with CTA protocol -pt's u/a on 07/18/14 revealed +Leukocytes, +Nitrites and 11-20 WBC's and slight leukocytosis.  Pt on course of Cipro from pre-op - I encouraged her to finish the entire course given and the importance of this given the fact she has a stent graft in place. -pt does not want OxyIR-will give Rx for  Tramadol -she is not on a statin-says she has tried them in the past and she did not tolerate them.   Leontine Locket, PA-C [facility] [phone] 07/20/2014 7:21 AM    2+ DP pulses Some soreness Agree with above D/c home  Ruta Hinds, MD [facility] [place] Office: [phone] Pager: [phone]

## 2014-07-20 NOTE — Progress Notes (Signed)
Left radial aline discontinued this morning per orders.Pressure held for 20 minutes and pressure dressing applied.

## 2014-07-20 NOTE — Telephone Encounter (Addendum)
sched cta abd/pel for 08/17/14 @ 12:30 at [facility]  sched cef for 08/24/14 @ 1:45  Lm on pt's hm# to inform pt of appt  Mailed ct ppw  ----- Message from Mena Goes, RN sent at 07/20/2014  9:20 AM EDT ----- Regarding: Schedule   ----- Message -----    From: Gabriel Earing, PA-C    Sent: 07/20/2014   7:34 AM      To: [facility] Charge Pool  S/p EVAR 07/19/14.  F/u with Dr. Oneida Alar in 4 weeks with CTA protocol.  Thanks, Aldona Bar

## 2014-07-24 ENCOUNTER — Telehealth: Payer: Self-pay

## 2014-07-24 ENCOUNTER — Ambulatory Visit (HOSPITAL_COMMUNITY)
Admission: RE | Admit: 2014-07-24 | Discharge: 2014-07-24 | Disposition: A | Discharge status: Home / Self Care | Payer: 59 | Source: Ambulatory Visit | Attending: Surgery | Admitting: Surgery

## 2014-07-24 ENCOUNTER — Encounter: Payer: Self-pay | Admitting: Surgery

## 2014-07-24 ENCOUNTER — Ambulatory Visit (INDEPENDENT_AMBULATORY_CARE_PROVIDER_SITE_OTHER): Payer: Self-pay | Admitting: Surgery

## 2014-07-24 VITALS — BP 118/81 | HR 80 | Ht 66.0 in | Wt 167.9 lb

## 2014-07-24 DIAGNOSIS — Z95828 Presence of other vascular implants and grafts: Secondary | ICD-10-CM

## 2014-07-24 DIAGNOSIS — R1031 Right lower quadrant pain: Secondary | ICD-10-CM

## 2014-07-24 DIAGNOSIS — R1030 Lower abdominal pain, unspecified: Secondary | ICD-10-CM

## 2014-07-24 DIAGNOSIS — R1032 Left lower quadrant pain: Secondary | ICD-10-CM

## 2014-07-24 DIAGNOSIS — G8918 Other acute postprocedural pain: Secondary | ICD-10-CM

## 2014-07-24 MED ORDER — TRAMADOL HCL 50 MG PO TABS: 50.0000 mg | ORAL_TABLET | Freq: Four times a day (QID) | ORAL | Status: DC | PRN

## 2014-07-24 NOTE — Telephone Encounter (Signed)
Phone call from pt.  Reported she has extreme pain intermittently in the bilateral groin areas, and extending into the upper thigh.  Denied any redness, warmth, swelling or drainage.  Stated there is bruising.  Denied that there is any raised area, within the bruising.  Stated she had been bloated in the abdomen, and spoke with Dr. Oneida Alar this past weekend; took MOM and Colace per Dr. Oneida Alar direction.  Reported she had a good BM today, and that the abdominal bloating has improved.  Denied fever or chills.  Stated the groin pain occurs with sitting on edge of bed, and when changing position from standing to laying down.  Discussed with Dr. Trula Slade.  Recommended to schedule bilateral LE Art duplex to evaluate that there is good blood flow.  Appt. given for 1:00 PM today.

## 2014-07-24 NOTE — Progress Notes (Signed)
Patient name: Gloria Morrison MRN: [id number] DOB: March 12, 1950 Sex: female     Chief Complaint  Patient presents with  . Re-evaluation    extreme bilateral groin pain s/p EVAR    HISTORY OF PRESENT ILLNESS: The patient is here for an unscheduled visit.  On 07/19/2014, she underwent endovascular aneurysm repair by Dr. Oneida Alar.  This was done via a percutaneous approach.  Since she has been at home she is complaining of groin pain as well as pain extending onto the anterior side of bilateral thighs.  She also feels that her belly has gotten more swollen, however today this has decreased.  She is concerned that she is having trouble with constipation.  She has been taking Ultram for pain control which has not been taking regularly.  Past Medical History  Diagnosis Date  . Hyperlipidemia     borderline;was on meds but has been off x 12 yrs   . AAA (abdominal aortic aneurysm) without rupture 07/06/2014  . Essential hypertension     takes Lisinopril daily  . Complication of anesthesia     hard to wake up (pt does not want Versed)  . History of bronchitis     a yr ago  . URI (upper respiratory infection)     Jan 2016  . Cough     allergies  . Headache     occasionally  . GERD (gastroesophageal reflux disease)     once in a while-will take OTC meds if needed  . Microscopic hematuria   . Insomnia     takes Lorazepam as needed    Past Surgical History  Procedure Laterality Date  . Tonsillectomy  as a child  . Breast fibroadenoma surgery Left 1986    lumpectomy  . Abdominal aortic endovascular stent graft N/A 07/19/2014    Procedure: ABDOMINAL AORTIC ENDOVASCULAR STENT GRAFT;  Surgeon: Elam Dutch, MD;  Location: [facility];  Service: Vascular;  Laterality: N/A;    History   Social History  . Marital Status: Married    Spouse Name: N/A  . Number of Children: N/A  . Years of Education: N/A   Occupational History  . Not on file.   Social History Main Topics  . Smoking  status: Current Every Day Smoker -- 0.25 packs/day    Types: Cigarettes    Start date: 02/18/1991  . Smokeless tobacco: Never Used  . Alcohol Use: Yes     Comment: 5-6 drinks per week  . Drug Use: No  . Sexual Activity: Yes    Birth Control/ Protection: Post-menopausal   Other Topics Concern  . Not on file   Social History Narrative    Family History  Problem Relation Age of Onset  . Melanoma Father   . Hypertension Sister     Allergies as of 07/24/2014  . (No Known Allergies)    Current Outpatient Prescriptions on File Prior to Visit  Medication Sig Dispense Refill  . aspirin EC 81 MG tablet Take 81 mg by mouth daily.     . calcium carbonate 1250 MG capsule Take 1,250 mg by mouth.    . ciprofloxacin (CIPRO) 500 MG tablet Take 1 tablet (500 mg total) by mouth 2 (two) times daily. For 7 days 14 tablet 0  . Coenzyme Q10 (CO Q 10) 10 MG CAPS Take 100 mg by mouth.    . estradiol (ESTRACE) 0.1 MG/GM vaginal cream Place 1 Applicatorful vaginally once a week.     Marland Kitchen lisinopril (  PRINIVIL,ZESTRIL) 5 MG tablet Take 5 mg by mouth daily.     Marland Kitchen LORazepam (ATIVAN) 1 MG tablet Take 1 mg by mouth every 8 (eight) hours as needed.     . Multiple Vitamins-Minerals (ONE DAILY PLUS MINERALS) TABS Take 1 tablet by mouth.     . traMADol (ULTRAM) 50 MG tablet Take 1 tablet (50 mg total) by mouth every 6 (six) hours as needed. 20 tablet 0   No current facility-administered medications on file prior to visit.       PHYSICAL EXAMINATION:   Vital signs are  Filed Vitals:   07/24/14 1413  BP: 118/81  Pulse: 80  Height: 5\' 6"  (1.676 m)  Weight: 167 lb 14.4 oz (76.159 kg)  SpO2: 96%   Body mass index is 27.11 kg/(m^2). General: The patient appears their stated age. HEENT:  No gross abnormalities Pulmonary:  Non labored breathing Abdomen: Soft and non-tender Musculoskeletal: There are no major deformities. Neurologic: No focal weakness or paresthesias are detected, Skin: There are no  ulcer or rashes noted. Psychiatric: The patient has normal affect. Cardiovascular: palpable dorsalis pedis pulses bilaterally.  Both groins have some small areas of ecchymosis and a very tender.  Femoral pulses are palpable.   Diagnostic Studies Duplex ultrasound was performed.  There is no evidence of pseudoaneurysm thrombosis or significant stenosis in either groin.  There was a stenosis in the distal left external iliac artery with less than 50% diameter reduction.  Assessment: Status post endovascular aneurysm repair Plan: I think the patient is just having exaggerated postoperative discomfort.  I do not see anything wrong with her on physical exam.  This is supported by her duplex ultrasound which was essentially negative.  I am going to give her an additional 30 bloods.  She will keep her regular scheduled appointment with Dr. Oneida Alar.  [name], M.D. [facility] [place] [place] Office: [phone] Pager:  [phone]

## 2014-08-14 ENCOUNTER — Other Ambulatory Visit: Payer: Self-pay | Admitting: Vascular Surgery

## 2014-08-14 LAB — CREATININE, SERUM: CREATININE: 0.54 mg/dL (ref 0.50–1.10)

## 2014-08-14 LAB — BUN: BUN: 18 mg/dL (ref 6–23)

## 2014-08-17 ENCOUNTER — Ambulatory Visit
Admission: RE | Admit: 2014-08-17 | Discharge: 2014-08-17 | Disposition: A | Discharge status: Home / Self Care | Payer: 59 | Source: Ambulatory Visit | Attending: Vascular Surgery | Admitting: Vascular Surgery

## 2014-08-17 DIAGNOSIS — Z48812 Encounter for surgical aftercare following surgery on the circulatory system: Secondary | ICD-10-CM

## 2014-08-17 DIAGNOSIS — I714 Abdominal aortic aneurysm, without rupture, unspecified: Secondary | ICD-10-CM

## 2014-08-17 MED ORDER — IOPAMIDOL (ISOVUE-370) INJECTION 76%
75.0000 mL | Freq: Once | INTRAVENOUS | Status: AC | PRN
  Administered 2014-08-17: 75 mL via INTRAVENOUS

## 2014-08-18 ENCOUNTER — Encounter: Payer: Self-pay | Admitting: Vascular Surgery

## 2014-08-24 ENCOUNTER — Encounter: Payer: Self-pay | Admitting: Vascular Surgery

## 2014-08-24 ENCOUNTER — Ambulatory Visit (INDEPENDENT_AMBULATORY_CARE_PROVIDER_SITE_OTHER): Payer: Self-pay | Admitting: Vascular Surgery

## 2014-08-24 VITALS — BP 169/95 | HR 81 | Ht 66.0 in | Wt 165.9 lb

## 2014-08-24 DIAGNOSIS — Z48812 Encounter for surgical aftercare following surgery on the circulatory system: Secondary | ICD-10-CM

## 2014-08-24 DIAGNOSIS — I714 Abdominal aortic aneurysm, without rupture, unspecified: Secondary | ICD-10-CM

## 2014-08-24 NOTE — Addendum Note (Signed)
Addended by: Dorthula Rue L on: 08/24/2014 03:27 PM   Modules accepted: Orders

## 2014-08-24 NOTE — Progress Notes (Signed)
  POST OPERATIVE OFFICE NOTE    CC:  F/u for surgery  HPI:  This is a 64 y.o. female who is s/p EVAR for asymptomatic AAA.  She states she still gets tiered faster than before, but her abdomin is feeling better and she has regular BM's.  No fever or chills and she is back to all ADL's she normally does.  No Known Allergies  Current Outpatient Prescriptions  Medication Sig Dispense Refill  . aspirin EC 81 MG tablet Take 81 mg by mouth daily.     . calcium carbonate 1250 MG capsule Take 1,250 mg by mouth.    . ciprofloxacin (CIPRO) 500 MG tablet Take 1 tablet (500 mg total) by mouth 2 (two) times daily. For 7 days 14 tablet 0  . Coenzyme Q10 (CO Q 10) 10 MG CAPS Take 100 mg by mouth.    . estradiol (ESTRACE) 0.1 MG/GM vaginal cream Place 1 Applicatorful vaginally once a week.     Marland Kitchen lisinopril (PRINIVIL,ZESTRIL) 5 MG tablet Take 5 mg by mouth daily.     Marland Kitchen LORazepam (ATIVAN) 1 MG tablet Take 1 mg by mouth every 8 (eight) hours as needed.     . Multiple Vitamins-Minerals (ONE DAILY PLUS MINERALS) TABS Take 1 tablet by mouth.     . traMADol (ULTRAM) 50 MG tablet Take 1 tablet (50 mg total) by mouth every 6 (six) hours as needed. 30 tablet 0   No current facility-administered medications for this visit.     ROS:  See HPI  Physical Exam:  Filed Vitals:   08/24/14 1354  BP: 169/95  Pulse:     Incision:  Well healed Extremities:  Warm well perfused with palpable DP 2+ pulses bil. 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 Supply from the left L4 lumbar artery appears more prominent than the right. The endograft appears well positioned. Largely thrombosed aneurysm sac surrounding the endograft is stable in size, measuring 6.8 x 7.2 cm.  Assessment/Plan:  This is a 64 y.o. female who is s/p: EVAR.    She is welcome to do any daily exercise she wants without restrictions.  She will  f/u in 6 months with a CTA she does have a small type II endo leak, but there is no change in the size of the AAA sac.     Theda Sers, Dariana Garbett MAUREEN PA-C [facility]   Clinic MD:  Pt seen and examined with Dr. Oneida Alar   History and exam details as above. Patient status post abdominal aortic aneurysm stent graft on June 1. Overall she is doing well. She does have a type II endoleak and I discussed this with her today. We will continue to observe this over time. As long as there is no change in the aneurysm diameter this can be safely observed. Patient will follow-up in 5 months time with a repeat CT Angio.  Ruta Hinds, MD [facility] [place] Office: [phone] Pager: [phone]

## 2015-01-19 ENCOUNTER — Other Ambulatory Visit: Payer: Self-pay | Admitting: Vascular Surgery

## 2015-01-19 LAB — CREATININE, SERUM: CREATININE: 0.49 mg/dL — AB (ref 0.50–0.99)

## 2015-01-25 ENCOUNTER — Ambulatory Visit
Admission: RE | Admit: 2015-01-25 | Discharge: 2015-01-25 | Disposition: A | Discharge status: Home / Self Care | Payer: 59 | Source: Ambulatory Visit | Attending: Vascular Surgery | Admitting: Vascular Surgery

## 2015-01-25 DIAGNOSIS — I714 Abdominal aortic aneurysm, without rupture, unspecified: Secondary | ICD-10-CM

## 2015-01-25 DIAGNOSIS — Z48812 Encounter for surgical aftercare following surgery on the circulatory system: Secondary | ICD-10-CM

## 2015-01-25 MED ORDER — IOPAMIDOL (ISOVUE-370) INJECTION 76%
100.0000 mL | Freq: Once | INTRAVENOUS | Status: AC | PRN
  Administered 2015-01-25: 100 mL via INTRAVENOUS

## 2015-01-26 ENCOUNTER — Encounter: Payer: Self-pay | Admitting: Vascular Surgery

## 2015-02-01 ENCOUNTER — Encounter: Payer: Self-pay | Admitting: Vascular Surgery

## 2015-02-01 ENCOUNTER — Ambulatory Visit (INDEPENDENT_AMBULATORY_CARE_PROVIDER_SITE_OTHER): Payer: 59 | Admitting: Vascular Surgery

## 2015-02-01 VITALS — BP 164/102 | HR 75 | Temp 98.3°F | Resp 18 | Ht 66.0 in | Wt 164.1 lb

## 2015-02-01 DIAGNOSIS — I714 Abdominal aortic aneurysm, without rupture, unspecified: Secondary | ICD-10-CM

## 2015-02-01 NOTE — Addendum Note (Signed)
Addended by: Dorthula Rue L on: 02/01/2015 03:04 PM   Modules accepted: Orders

## 2015-02-01 NOTE — Progress Notes (Signed)
Filed Vitals:   02/01/15 1300 02/01/15 1304  BP: 170/97 164/102  Pulse: 75   Temp: 98.3 F (36.8 C)   Resp: 18

## 2015-02-01 NOTE — Progress Notes (Signed)
[facility] HISTORY AND PHYSICAL   History of Present Illness:  Patient is a 64 y.o. year old female who presents for evaluation of AAA repair EVAR.  She is eating without nausea or vomiting.  She reports no severe back pain or abdominal pain.  Other medical problems include has AAA (abdominal aortic aneurysm) without rupture ([facility]) and AAA (abdominal aortic aneurysm) ([facility]) on her problem list.  Past Medical History  Diagnosis Date  . Hyperlipidemia     borderline;was on meds but has been off x 12 yrs   . AAA (abdominal aortic aneurysm) without rupture ([facility]) 07/06/2014  . Essential hypertension     takes Lisinopril daily  . Complication of anesthesia     hard to wake up (pt does not want Versed)  . History of bronchitis     a yr ago  . URI (upper respiratory infection)     Jan 2016  . Cough     allergies  . Headache     occasionally  . GERD (gastroesophageal reflux disease)     once in a while-will take OTC meds if needed  . Microscopic hematuria   . Insomnia     takes Lorazepam as needed    Past Surgical History  Procedure Laterality Date  . Tonsillectomy  as a child  . Breast fibroadenoma surgery Left 1986    lumpectomy  . Abdominal aortic endovascular stent graft N/A 07/19/2014    Procedure: ABDOMINAL AORTIC ENDOVASCULAR STENT GRAFT;  Surgeon: Elam Dutch, MD;  Location: [facility] OR;  Service: Vascular;  Laterality: N/A;    Social History Social History  Substance Use Topics  . Smoking status: Current Every Day Smoker -- 0.25 packs/day    Types: Cigarettes    Start date: 02/18/1991  . Smokeless tobacco: Never Used  . Alcohol Use: Yes     Comment: 5-6 drinks per week    Family History Family History  Problem Relation Age of Onset  . Melanoma Father   . Hypertension Sister     Allergies  No Known Allergies   Current Outpatient Prescriptions  Medication Sig Dispense Refill  . aspirin EC 81 MG tablet Take 81 mg by mouth daily.      . calcium carbonate 1250 MG capsule Take 1,250 mg by mouth.    . Coenzyme Q10 (CO Q 10) 10 MG CAPS Take 100 mg by mouth.    . estradiol (ESTRACE) 0.1 MG/GM vaginal cream Place 1 Applicatorful vaginally once a week.     Marland Kitchen LORazepam (ATIVAN) 1 MG tablet Take 1 mg by mouth every 8 (eight) hours as needed.     Marland Kitchen losartan (COZAAR) 100 MG tablet Take 100 mg by mouth daily.    . Multiple Vitamins-Minerals (ONE DAILY PLUS MINERALS) TABS Take 1 tablet by mouth.     . traMADol (ULTRAM) 50 MG tablet Take 1 tablet (50 mg total) by mouth every 6 (six) hours as needed. 30 tablet 0  . ciprofloxacin (CIPRO) 500 MG tablet Take 1 tablet (500 mg total) by mouth 2 (two) times daily. For 7 days (Patient not taking: Reported on 02/01/2015) 14 tablet 0  . lisinopril (PRINIVIL,ZESTRIL) 5 MG tablet Take 5 mg by mouth daily. Reported on 02/01/2015     No current facility-administered medications for this visit.    ROS:   General:  No weight loss, Fever, chills  HEENT: No recent headaches, no nasal bleeding, no visual changes, no sore throat  Neurologic: No dizziness,  blackouts, seizures. No recent symptoms of stroke or mini- stroke. No recent episodes of slurred speech, or temporary blindness.  Cardiac: No recent episodes of chest pain/pressure, no shortness of breath at rest.  No shortness of breath with exertion.  Denies history of atrial fibrillation or irregular heartbeat  Vascular: No history of rest pain in feet.  No history of claudication.  No history of non-healing ulcer, No history of DVT   Pulmonary: No home oxygen, no productive cough, no hemoptysis,  No asthma or wheezing  Musculoskeletal:  [x ] Arthritis, [ ]  Low back pain,  [ ]  Joint pain  Hematologic:No history of hypercoagulable state.  No history of easy bleeding.  No history of anemia  Gastrointestinal: No hematochezia or melena,  No gastroesophageal reflux, no trouble swallowing  Urinary: [ ]  chronic Kidney disease, [ ]  on HD - [ ]  MWF  or [ ]  TTHS, [ ]  Burning with urination, [ ]  Frequent urination, [ ]  Difficulty urinating;   Skin: No rashes  Psychological: No history of anxiety,  No history of depression   Physical Examination  Filed Vitals:   02/01/15 1300 02/01/15 1304  BP: 170/97 164/102  Pulse: 75   Temp: 98.3 F (36.8 C)   TempSrc: Oral   Resp: 18   Height: 5\' 6"  (1.676 m)   Weight: 164 lb 1.6 oz (74.435 kg)   SpO2: 99%     Body mass index is 26.5 kg/(m^2).  General:  Alert and oriented, no acute distress HEENT: Normal Neck: No bruit or JVD Pulmonary: Clear to auscultation bilaterally Cardiac: Regular Rate and Rhythm without murmur Abdomen: Soft, non-tender, non-distended, no mass, no scars Skin: No rash Extremity Pulses:  2+ radial, brachial, femoral, dorsalis pedis,pulses bilaterally Musculoskeletal: No deformity or edema  Neurologic: Upper and lower extremity motor 5/5 and symmetric  DATA:   CTA AB/pelvis  IMPRESSION: 1. Patent infrarenal bifurcated aortic stent graft with apparent resolution of previously noted endoleak, minimal decrease in size of native sac diameter.  The scan was reviewed by Dr. Oneida Alar the aneurysmal sack has decreased by 1 mm and her type II edoleak has resolved.   ASSESSMENT:   AAA s/p EVAR 6 months post-op    PLAN:   F/U in 6 months for EVAR ultrasound study. We discussed her smoking cessation and she has decreased it by 50%.   Activity as tolerates.  Theda Sers, EMMA MAUREEN PA-C [facility]  The patient was seen in conjunction with Dr. Oneida Alar  History and exam details as above. Patient's abdominal aortic aneurysm has decreased in size from 7.2-7.1 cm. Previous type II endoleak is no longer visualized. Patient was again counseled and smoking cessation today. She will follow-up with a ultrasound of her aortic aneurysm in 6 months time.  Ruta Hinds, MD [facility] [place] Office:  [phone] Pager: [phone]

## 2015-02-01 NOTE — Progress Notes (Deleted)
[facility] [place]  Patient name: Gloria Morrison MRN: [id number] DOB: May 14, 1950 Sex: female  REASON FOR CONSULT: ***  HPI: Gloria Morrison is a 64 y.o. female, who is ***  Past Medical History  Diagnosis Date  . Hyperlipidemia     borderline;was on meds but has been off x 12 yrs   . AAA (abdominal aortic aneurysm) without rupture ([place]) 07/06/2014  . Essential hypertension     takes Lisinopril daily  . Complication of anesthesia     hard to wake up (pt does not want Versed)  . History of bronchitis     a yr ago  . URI (upper respiratory infection)     Jan 2016  . Cough     allergies  . Headache     occasionally  . GERD (gastroesophageal reflux disease)     once in a while-will take OTC meds if needed  . Microscopic hematuria   . Insomnia     takes Lorazepam as needed    Family History  Problem Relation Age of Onset  . Melanoma Father   . Hypertension Sister     SOCIAL HISTORY: Social History   Social History  . Marital Status: Married    Spouse Name: N/A  . Number of Children: N/A  . Years of Education: N/A   Occupational History  . Not on file.   Social History Main Topics  . Smoking status: Current Every Day Smoker -- 0.25 packs/day    Types: Cigarettes    Start date: 02/18/1991  . Smokeless tobacco: Never Used  . Alcohol Use: Yes     Comment: 5-6 drinks per week  . Drug Use: No  . Sexual Activity: Yes    Birth Control/ Protection: Post-menopausal   Other Topics Concern  . Not on file   Social History Narrative    No Known Allergies  Current Outpatient Prescriptions  Medication Sig Dispense Refill  . aspirin EC 81 MG tablet Take 81 mg by mouth daily.     . calcium carbonate 1250 MG capsule Take 1,250 mg by mouth.    . Coenzyme Q10 (CO Q 10) 10 MG CAPS Take 100 mg by mouth.    . estradiol (ESTRACE) 0.1 MG/GM vaginal cream Place 1 Applicatorful vaginally once a week.     Marland Kitchen LORazepam (ATIVAN) 1 MG tablet Take 1 mg by  mouth every 8 (eight) hours as needed.     Marland Kitchen losartan (COZAAR) 100 MG tablet Take 100 mg by mouth daily.    . Multiple Vitamins-Minerals (ONE DAILY PLUS MINERALS) TABS Take 1 tablet by mouth.     . traMADol (ULTRAM) 50 MG tablet Take 1 tablet (50 mg total) by mouth every 6 (six) hours as needed. 30 tablet 0  . ciprofloxacin (CIPRO) 500 MG tablet Take 1 tablet (500 mg total) by mouth 2 (two) times daily. For 7 days (Patient not taking: Reported on 02/01/2015) 14 tablet 0  . lisinopril (PRINIVIL,ZESTRIL) 5 MG tablet Take 5 mg by mouth daily. Reported on 02/01/2015     No current facility-administered medications for this visit.    REVIEW OF SYSTEMS:  [X]  denotes positive finding, [ ]  denotes negative finding Cardiac  Comments:  Chest pain or chest pressure: ***   Shortness of breath upon exertion:    Short of breath when lying flat:    Irregular heart rhythm:        Vascular    Pain in calf, thigh, or  hip brought on by ambulation:    Pain in feet at night that wakes you up from your sleep:     Blood clot in your veins:    Leg swelling:         Pulmonary    Oxygen at home:    Productive cough:     Wheezing:         Neurologic    Sudden weakness in arms or legs:     Sudden numbness in arms or legs:     Sudden onset of difficulty speaking or slurred speech:    Temporary loss of vision in one eye:     Problems with dizziness:         Gastrointestinal    Blood in stool:     Vomited blood:         Genitourinary    Burning when urinating:     Blood in urine:        Psychiatric    Major depression:         Hematologic    Bleeding problems:    Problems with blood clotting too easily:        Skin    Rashes or ulcers:        Constitutional    Fever or chills:      PHYSICAL EXAM: Filed Vitals:   02/01/15 1300 02/01/15 1304  BP: 170/97 164/102  Pulse: 75   Temp: 98.3 F (36.8 C)   TempSrc: Oral   Resp: 18   Height: 5\' 6"  (1.676 m)   Weight: 164 lb 1.6 oz (74.435  kg)   SpO2: 99%     GENERAL: The patient is a well-nourished female, in no acute distress. The vital signs are documented above. CARDIAC: There is a regular rate and rhythm.  VASCULAR: *** PULMONARY: There is good air exchange bilaterally without wheezing or rales. ABDOMEN: Soft and non-tender with normal pitched bowel sounds.  MUSCULOSKELETAL: There are no major deformities or cyanosis. NEUROLOGIC: No focal weakness or paresthesias are detected. SKIN: There are no ulcers or rashes noted. PSYCHIATRIC: The patient has a normal affect.  DATA:  ***  MEDICAL ISSUES: ***   [facility] [place] Beeper: [phone]

## 2015-08-02 ENCOUNTER — Encounter: Payer: Self-pay | Admitting: Vascular Surgery

## 2015-08-09 ENCOUNTER — Ambulatory Visit (INDEPENDENT_AMBULATORY_CARE_PROVIDER_SITE_OTHER): Payer: BLUE CROSS/BLUE SHIELD | Admitting: Vascular Surgery

## 2015-08-09 ENCOUNTER — Encounter: Payer: Self-pay | Admitting: Vascular Surgery

## 2015-08-09 ENCOUNTER — Ambulatory Visit (HOSPITAL_COMMUNITY)
Admission: RE | Admit: 2015-08-09 | Discharge: 2015-08-09 | Disposition: A | Discharge status: Home / Self Care | Payer: BLUE CROSS/BLUE SHIELD | Source: Ambulatory Visit | Attending: Vascular Surgery | Admitting: Vascular Surgery

## 2015-08-09 VITALS — BP 124/68 | HR 60 | Temp 97.4°F | Resp 16 | Ht 66.0 in | Wt 160.0 lb

## 2015-08-09 DIAGNOSIS — I714 Abdominal aortic aneurysm, without rupture, unspecified: Secondary | ICD-10-CM

## 2015-08-09 NOTE — Progress Notes (Signed)
[facility] [place] HISTORY AND PHYSICAL    History of Present Illness:  Patient is a 65 y.o. year old female who presents for follow-up evaluation of AAA post EVAR.  She had a Gore Excluder stent graft placed 07/19/2014. She reports no severe back pain or abdominal pain.  Other medical problems include hyperlipidemia, hypertension and bronchitis. These are all currently stable.    She continues to smoke about a quarter pack of cigarettes today. We discussed quitting again today.   Past Medical History   Diagnosis  Date   .  Hyperlipidemia         borderline;was on meds but has been off x 12 yrs    .  AAA (abdominal aortic aneurysm) without rupture ([place])  07/06/2014   .  Essential hypertension         takes Lisinopril daily   .  Complication of anesthesia         hard to wake up (pt does not want Versed)   .  History of bronchitis         a yr ago   .  URI (upper respiratory infection)         Jan 2016   .  Cough         allergies   .  Headache         occasionally   .  GERD (gastroesophageal reflux disease)         once in a while-will take OTC meds if needed   .  Microscopic hematuria     .  Insomnia         takes Lorazepam as needed       Past Surgical History   Procedure  Laterality  Date   .  Tonsillectomy    as a child   .  Breast fibroadenoma surgery  Left  1986       lumpectomy   .  Abdominal aortic endovascular stent graft  N/A  07/19/2014       Procedure: ABDOMINAL AORTIC ENDOVASCULAR STENT GRAFT;  Surgeon: Elam Dutch, MD;  Location: [facility];  Service: Vascular;  Laterality: N/A;    Social History   Social History  . Marital Status: Married    Spouse Name: N/A  . Number of Children: N/A  . Years of Education: N/A   Occupational History  . Not on file.   Social History Main Topics  . Smoking status: Current Every Day Smoker -- 0.25 packs/day    Types: Cigarettes    Start date: 02/18/1991  . Smokeless tobacco: Never Used  . Alcohol  Use: Yes     Comment: 5-6 drinks per week  . Drug Use: No  . Sexual Activity: Yes    Birth Control/ Protection: Post-menopausal   Other Topics Concern  . Not on file   Social History Narrative   Family History Family History   Problem  Relation  Age of Onset   .  Melanoma  Father     .  Hypertension  Sister       Allergies  No Known Allergies     Current Outpatient Prescriptions on File Prior to Visit  Medication Sig Dispense Refill  . aspirin EC 81 MG tablet Take 81 mg by mouth daily.     . calcium carbonate 1250 MG capsule Take 1,250 mg by mouth.    . Coenzyme Q10 (CO Q 10) 10 MG CAPS Take 100 mg by mouth.    Marland Kitchen  estradiol (ESTRACE) 0.1 MG/GM vaginal cream Place 1 Applicatorful vaginally once a week.     . losartan (COZAAR) 100 MG tablet Take 100 mg by mouth daily.    . Multiple Vitamins-Minerals (ONE DAILY PLUS MINERALS) TABS Take 1 tablet by mouth.     Marland Kitchen lisinopril (PRINIVIL,ZESTRIL) 5 MG tablet Take 5 mg by mouth daily. Reported on 02/01/2015     No current facility-administered medications on file prior to visit.    ROS:    General:  No weight loss, Fever, chills  HEENT: No recent headaches, no nasal bleeding, no visual changes, no sore throat  Neurologic: No dizziness, blackouts, seizures. No recent symptoms of stroke or mini- stroke. No recent episodes of slurred speech, or temporary blindness.  Cardiac: No recent episodes of chest pain/pressure, no shortness of breath at rest.  No shortness of breath with exertion.  Denies history of atrial fibrillation or irregular heartbeat  Vascular: No history of rest pain in feet.  No history of claudication.  No history of non-healing ulcer, No history of DVT    Pulmonary: No home oxygen, no productive cough, no hemoptysis,  No asthma or wheezing  Musculoskeletal:  [x ] Arthritis, [ ]  Low back pain,  [ ]  Joint pain  Hematologic:No history of hypercoagulable state.  No history of easy bleeding.  No history of  anemia  Gastrointestinal: No hematochezia or melena,  No gastroesophageal reflux, no trouble swallowing  Urinary: [ ]  chronic Kidney disease, [ ]  on HD - [ ]  MWF or [ ]  TTHS, [ ]  Burning with urination, [ ]  Frequent urination, [ ]  Difficulty urinating;    Skin: No rashes  Psychological: No history of anxiety,  No history of depression   Physical Examination     Filed Vitals:   08/09/15 0856  BP: 124/68  Pulse: 60  Temp: 97.4 F (36.3 C)  TempSrc: Oral  Resp: 16  Height: 5\' 6"  (1.676 m)  Weight: 160 lb (72.576 kg)  SpO2: 97%    General:  Alert and oriented, no acute distress HEENT: Normal Neck: No bruit or JVD Pulmonary: Clear to auscultation bilaterally Cardiac: Regular Rate and Rhythm without murmur Abdomen: Soft, non-tender, non-distended, no mass, no scars Skin: No rash Extremity Pulses:  2+ radial, brachial, femoral, dorsalis pedis,pulses bilaterally Musculoskeletal: No deformity or edema     Neurologic: Upper and lower extremity motor 5/5 and symmetric  DATA:   Ultrasound of the aorta was performed today. Aneurysm diameter is 5.8 cm compared to 7.1 cm preoperatively.     ASSESSMENT:    Doing well status post endovascular repair aneurysm has decreased in size from 7.1 to 5.8 cm.  Patient was again counseled and smoking cessation today.   She will follow-up with a ultrasound of her aortic aneurysm in 12 months time.  Ruta Hinds, MD [facility] [place] Office: [phone] Pager: [phone]

## 2016-01-08 ENCOUNTER — Other Ambulatory Visit: Payer: Self-pay

## 2016-01-08 DIAGNOSIS — I714 Abdominal aortic aneurysm, without rupture, unspecified: Secondary | ICD-10-CM

## 2016-03-25 ENCOUNTER — Encounter: Payer: Self-pay | Admitting: Cardiology

## 2016-05-06 DIAGNOSIS — J302 Other seasonal allergic rhinitis: Secondary | ICD-10-CM | POA: Insufficient documentation

## 2016-05-06 DIAGNOSIS — J3089 Other allergic rhinitis: Secondary | ICD-10-CM | POA: Insufficient documentation

## 2016-05-06 DIAGNOSIS — K219 Gastro-esophageal reflux disease without esophagitis: Secondary | ICD-10-CM | POA: Insufficient documentation

## 2016-08-18 ENCOUNTER — Encounter: Payer: Self-pay | Admitting: Vascular Surgery

## 2016-08-21 ENCOUNTER — Other Ambulatory Visit (HOSPITAL_COMMUNITY): Payer: BLUE CROSS/BLUE SHIELD

## 2016-08-21 ENCOUNTER — Ambulatory Visit: Payer: BLUE CROSS/BLUE SHIELD | Admitting: Vascular Surgery

## 2016-08-28 ENCOUNTER — Ambulatory Visit: Payer: BLUE CROSS/BLUE SHIELD | Admitting: Vascular Surgery

## 2016-08-28 ENCOUNTER — Other Ambulatory Visit (HOSPITAL_COMMUNITY): Payer: BLUE CROSS/BLUE SHIELD

## 2016-09-19 ENCOUNTER — Other Ambulatory Visit: Payer: Self-pay | Admitting: Nurse Practitioner

## 2016-09-19 DIAGNOSIS — R140 Abdominal distension (gaseous): Secondary | ICD-10-CM

## 2016-09-22 ENCOUNTER — Other Ambulatory Visit: Payer: Self-pay | Admitting: Nurse Practitioner

## 2016-09-22 DIAGNOSIS — R140 Abdominal distension (gaseous): Secondary | ICD-10-CM

## 2016-09-25 ENCOUNTER — Other Ambulatory Visit: Payer: BLUE CROSS/BLUE SHIELD

## 2016-09-29 ENCOUNTER — Ambulatory Visit
Admission: RE | Admit: 2016-09-29 | Discharge: 2016-09-29 | Disposition: A | Discharge status: Home / Self Care | Payer: No Typology Code available for payment source | Source: Ambulatory Visit | Attending: Nurse Practitioner | Admitting: Nurse Practitioner

## 2016-09-29 ENCOUNTER — Other Ambulatory Visit: Payer: BLUE CROSS/BLUE SHIELD

## 2016-09-29 DIAGNOSIS — R140 Abdominal distension (gaseous): Secondary | ICD-10-CM

## 2016-10-02 ENCOUNTER — Encounter: Payer: Self-pay | Admitting: Internal Medicine

## 2016-10-07 ENCOUNTER — Ambulatory Visit (HOSPITAL_COMMUNITY)
Admission: RE | Admit: 2016-10-07 | Discharge: 2016-10-07 | Disposition: A | Discharge status: Home / Self Care | Payer: Medicare Other | Source: Ambulatory Visit | Attending: Vascular Surgery | Admitting: Vascular Surgery

## 2016-10-07 ENCOUNTER — Encounter: Payer: Self-pay | Admitting: Vascular Surgery

## 2016-10-07 DIAGNOSIS — I714 Abdominal aortic aneurysm, without rupture, unspecified: Secondary | ICD-10-CM

## 2016-10-08 ENCOUNTER — Encounter: Payer: Self-pay | Admitting: Vascular Surgery

## 2016-10-09 ENCOUNTER — Ambulatory Visit (INDEPENDENT_AMBULATORY_CARE_PROVIDER_SITE_OTHER): Payer: Medicare Other | Admitting: Vascular Surgery

## 2016-10-09 ENCOUNTER — Encounter: Payer: Self-pay | Admitting: Vascular Surgery

## 2016-10-09 VITALS — BP 144/90 | HR 78 | Temp 98.4°F | Resp 18 | Ht 66.0 in | Wt 168.0 lb

## 2016-10-09 DIAGNOSIS — I714 Abdominal aortic aneurysm, without rupture, unspecified: Secondary | ICD-10-CM

## 2016-10-09 NOTE — Progress Notes (Signed)
Patient is a 66 year old female who returns for follow-up today after endovascular aneurysm repair of abdominal aortic aneurysm June 2016. She has had some intermittent symptoms of reflux and upper abdominal pain and is scheduled to be seen by Dr. Henrene Pastor from gastroenterology on October 4. She denies any weight loss. She denies any postprandial pain or food fear. She denies any claudication symptoms.  She quit smoking 6 months ago.  Past Medical History:  Diagnosis Date  . AAA (abdominal aortic aneurysm) without rupture ([place]) 07/06/2014  . Complication of anesthesia    hard to wake up (pt does not want Versed)  . Cough    allergies  . Essential hypertension    takes Lisinopril daily  . GERD (gastroesophageal reflux disease)    once in a while-will take OTC meds if needed  . Headache    occasionally  . History of bronchitis    a yr ago  . Hyperlipidemia    borderline;was on meds but has been off x 12 yrs   . Insomnia    takes Lorazepam as needed  . Microscopic hematuria   . URI (upper respiratory infection)    Jan 2016    Past Surgical History:  Procedure Laterality Date  . ABDOMINAL AORTIC ENDOVASCULAR STENT GRAFT N/A 07/19/2014   Procedure: ABDOMINAL AORTIC ENDOVASCULAR STENT GRAFT;  Surgeon: Elam Dutch, MD;  Location: [facility];  Service: Vascular;  Laterality: N/A;  . BREAST FIBROADENOMA SURGERY Left 1986   lumpectomy  . TONSILLECTOMY  as a child     Review of systems: She denies shortness of breath. She denies chest pain.  Current Outpatient Prescriptions on File Prior to Visit  Medication Sig Dispense Refill  . aspirin EC 81 MG tablet Take 81 mg by mouth daily.     . calcium carbonate 1250 MG capsule Take 1,250 mg by mouth.    . Coenzyme Q10 (CO Q 10) 10 MG CAPS Take 100 mg by mouth.    . estradiol (ESTRACE) 0.1 MG/GM vaginal cream Place 1 Applicatorful vaginally once a week.     . losartan (COZAAR) 100 MG tablet Take 100 mg by mouth daily.    . Multiple  Vitamins-Minerals (ONE DAILY PLUS MINERALS) TABS Take 1 tablet by mouth.      No current facility-administered medications on file prior to visit.     Physical exam:  Vitals:   10/09/16 1150  BP: (!) 144/90  Pulse: 78  Resp: 18  Temp: 98.4 F (36.9 C)  SpO2: 94%  Weight: 168 lb (76.2 kg)  Height: 5\' 6"  (1.676 m)    Abdomen: Soft nontender nondistended no pulsatile mass  Extremities: 2+ femoral popliteal dorsalis pedis pulses bilaterally  Data: Patient had an ultrasound of her abdominal aorta 10/07/2016. This shows that the aortic diameter is now 4.1 cm decreased from 7 cm preoperatively.  Assessment: Doing well status post endovascular aneurysm repair of abdominal aortic aneurysm. Continued decrease in size of her aortic diameter.  Plan: Patient will follow-up in one year with our nurse practitioner an aortic ultrasound at that time.  Ruta Hinds, MD [facility] [place] Office: [phone] Pager: [phone]

## 2016-10-21 NOTE — Addendum Note (Signed)
Addended by: Lianne Cure A on: 10/21/2016 12:57 PM   Modules accepted: Orders

## 2016-11-20 ENCOUNTER — Other Ambulatory Visit (HOSPITAL_COMMUNITY): Payer: No Typology Code available for payment source

## 2016-11-20 ENCOUNTER — Ambulatory Visit: Payer: No Typology Code available for payment source | Admitting: Vascular Surgery

## 2016-12-02 ENCOUNTER — Encounter (INDEPENDENT_AMBULATORY_CARE_PROVIDER_SITE_OTHER): Payer: Self-pay

## 2016-12-02 ENCOUNTER — Encounter: Payer: Self-pay | Admitting: Internal Medicine

## 2016-12-02 ENCOUNTER — Ambulatory Visit (INDEPENDENT_AMBULATORY_CARE_PROVIDER_SITE_OTHER): Payer: Medicare HMO | Admitting: Internal Medicine

## 2016-12-02 VITALS — BP 120/86 | HR 72 | Ht 66.0 in | Wt 171.2 lb

## 2016-12-02 DIAGNOSIS — J989 Respiratory disorder, unspecified: Secondary | ICD-10-CM

## 2016-12-02 DIAGNOSIS — R070 Pain in throat: Secondary | ICD-10-CM

## 2016-12-02 DIAGNOSIS — Z1211 Encounter for screening for malignant neoplasm of colon: Secondary | ICD-10-CM

## 2016-12-02 DIAGNOSIS — K219 Gastro-esophageal reflux disease without esophagitis: Secondary | ICD-10-CM | POA: Diagnosis not present

## 2016-12-02 NOTE — Progress Notes (Signed)
HISTORY OF PRESENT ILLNESS:  Gloria Morrison is a 66 y.o. female with chronic tobacco abuse, hypertension, and prior abdominal aortic stent graft placement 2016 who is SELF REFERRED regarding a myriad of pharyngeal and respiratory complaints. Patient is a suboptimal historian. She tells me that she had problems with sore throat, allergies, heartburn, and chronic rhinorrhea beginning in February. At that time she tells me that she stop smoking. She tells me that she thought she saw a gastroenterologist, but it sounds to me like she saw an ENT specialist. She cannot remember the name. She states she was diagnosed with acid reflux. Sounds like she had laryngoscopy though I'm not sure. Placed on pantoprazole 40 mg daily which she continues on. Has gained 5 pounds since that time. Does report occasional indigestion. Denies dysphagia. She does complain of chronic abdominal bloating. Symptoms slightly worse after meals. Otherwise no exacerbating or relieving factors. For this complaint her PCP ordered an abdominal ultrasound which was performed 09/29/2016. I have reviewed this. Examination does not reveal any significant abnormalities. Patient was questioned regarding colon cancer screening. She tells me that she thinks she had a colonoscopy about 8 or 10 years ago. She also tells me that she does not want to discuss colon cancer screening strategies any further at this time that she may wish to do so in the future. It seems that her biggest issue is to see if acid reflux is causing her pharyngeal and respiratory complaints.  REVIEW OF SYSTEMS:  All non-GI ROS negative unless otherwise stated in the history of present illness except for sinus and allergy trouble, cough, sore throat  Past Medical History:  Diagnosis Date  . AAA (abdominal aortic aneurysm) without rupture ([place]) 07/06/2014  . Complication of anesthesia    hard to wake up (pt does not want Versed)  . Cough    allergies  . Essential hypertension     takes Lisinopril daily  . GERD (gastroesophageal reflux disease)    once in a while-will take OTC meds if needed  . Headache    occasionally  . History of bronchitis    a yr ago  . Hyperlipidemia    borderline;was on meds but has been off x 12 yrs   . Insomnia    takes Lorazepam as needed  . Microscopic hematuria   . URI (upper respiratory infection)    Jan 2016    Past Surgical History:  Procedure Laterality Date  . ABDOMINAL AORTIC ENDOVASCULAR STENT GRAFT N/A 07/19/2014   Procedure: ABDOMINAL AORTIC ENDOVASCULAR STENT GRAFT;  Surgeon: Elam Dutch, MD;  Location: [facility];  Service: Vascular;  Laterality: N/A;  . BREAST FIBROADENOMA SURGERY Left 1986   lumpectomy  . TONSILLECTOMY  as a child    Social History Gloria Morrison  reports that she quit smoking about 8 months ago. Her smoking use included Cigarettes. She started smoking about 25 years ago. She smoked 0.25 packs per day. She has never used smokeless tobacco. She reports that she drinks alcohol. She reports that she does not use drugs.  family history includes Crohn's disease in her brother; Hypertension in her sister; Melanoma in her father.  No Known Allergies     PHYSICAL EXAMINATION: Vital signs: BP 120/86   Pulse 72   Ht _0  (1.676 m)   Wt 171 lb 3.2 oz (77.7 kg)   BMI 27.63 kg/m   Constitutional: generally well-appearing, no acute distress Psychiatric: alert and oriented x3, cooperative Eyes: extraocular movements intact, anicteric,  conjunctiva pink Mouth: oral pharynx moist, no obvious lesions Neck: supple no lymphadenopathy Cardiovascular: heart regular rate and rhythm, no murmur Lungs: clear to auscultation bilaterally Abdomen: soft, nontender, nondistended, no obvious ascites, no peritoneal signs, normal bowel sounds, no organomegaly Rectal: Omitted Extremities: no clubbing, cyanosis, or lower extremity edema bilaterally Skin: Wrinkling of the skin consistent with chronic smoker. Otherwise  no lesions on visible extremities Neuro: No focal deficits. Cranial nerves intact  ASSESSMENT:  #1. GERD #2. Pharyngeal complaints of uncertain etiology. Apparently seen by ENT previously. No details #3. Upper respiratory complaints #4. Colon cancer screening. She is not interested. Apparently had colonoscopy 8 or 10 years ago. No details #5. Abdominal bloating without alarm features  PLAN:  #1. Reflux precautions #2. Continue smoking cessation #3. Continue PPI for now #4. Schedule upper endoscopy to evaluate reflux symptoms and abdominal bloating.The nature of the procedure, as well as the risks, benefits, and alternatives were carefully and thoroughly reviewed with the patient. Ample time for discussion and questions allowed. The patient understood, was satisfied, and agreed to proceed. #5. Return to ENT for pharyngeal complaints #6. Return to primary care physician regarding upper respiratory complaints  A copy of this note sent to patient's PCP

## 2016-12-02 NOTE — Patient Instructions (Signed)

## 2016-12-18 IMAGING — CT CT CTA ABD/PEL W/CM AND/OR W/O CM
3 of 10 series · 13 of 36 positions shown, 18 images · IV contrast (75CC ISOVUE 370)
Comparison: 07/13/2014

CLINICAL DATA: Status Farruko Tamallo to treat abdominal aortic aneurysm
on 07/19/2014.

EXAM:
CTA ABDOMEN AND PELVIS WITHOUT CONTRAST
TECHNIQUE: Multidetector CT imaging of the abdomen and pelvis was performed
using the standard protocol during bolus administration of
intravenous contrast. Multiplanar reconstructed images and MIPs were
obtained and reviewed to evaluate the vascular anatomy.
CONTRAST:  75 mL Isovue 370 IV

[Series 5: angio 2.5 · axial · 0.81mm/px · z∈[-317,+13]mm · 6 of 186 slices shown, 11 images]
[im 27/186  soft-tissue]
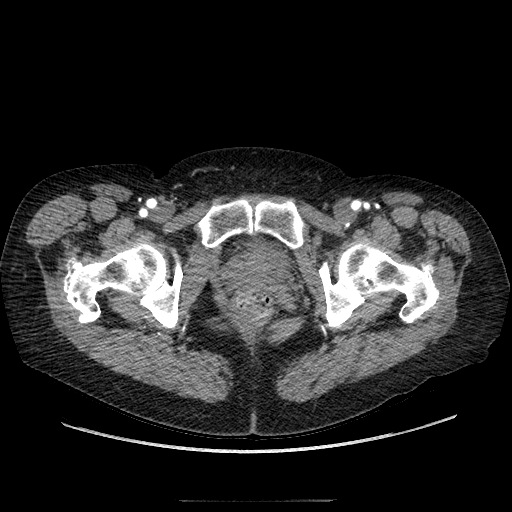
[im 27/186  bone]
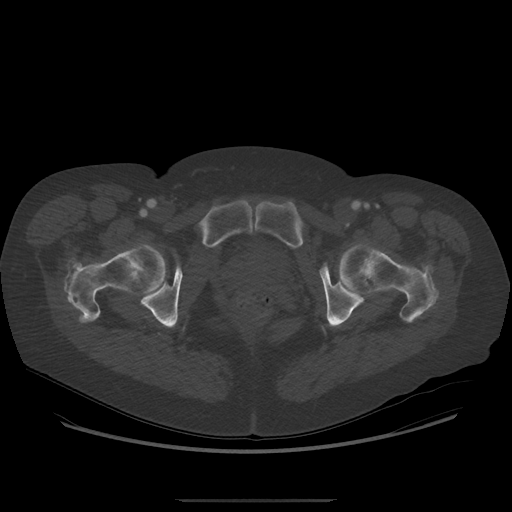
[im 53/186  soft-tissue]
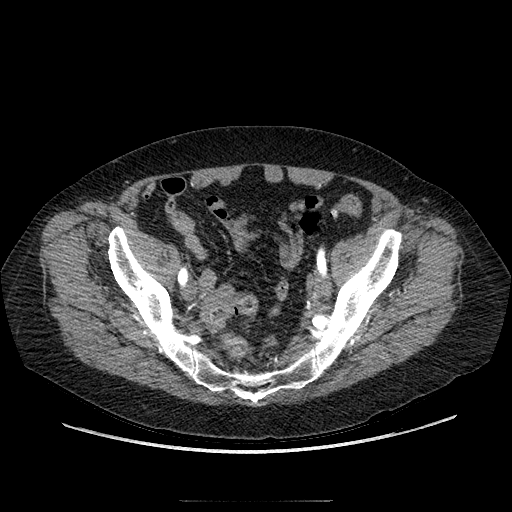
[im 80/186  soft-tissue]
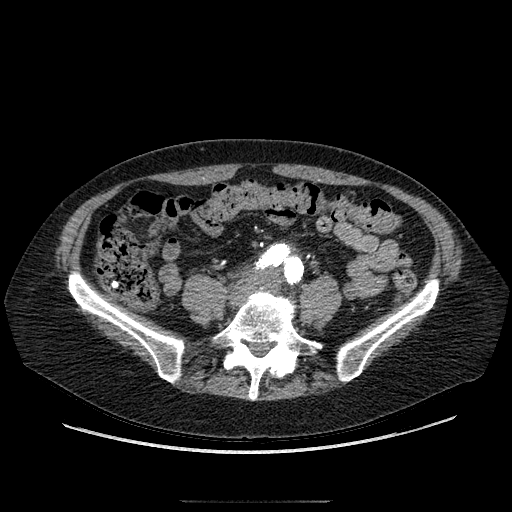
[im 80/186  lung]
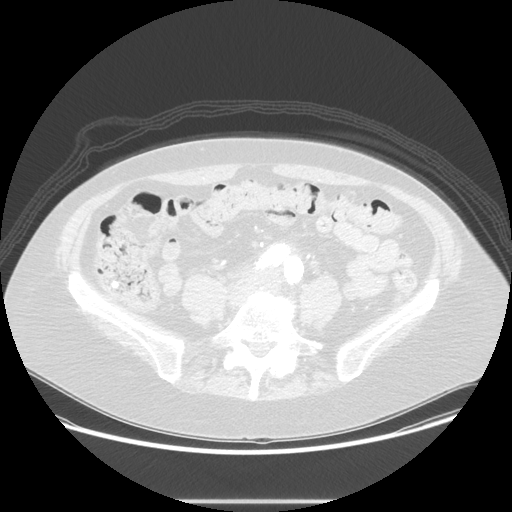
[im 106/186  soft-tissue]
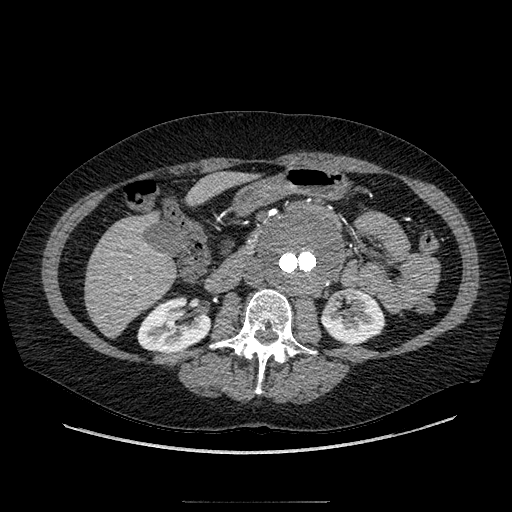
[im 106/186  lung]
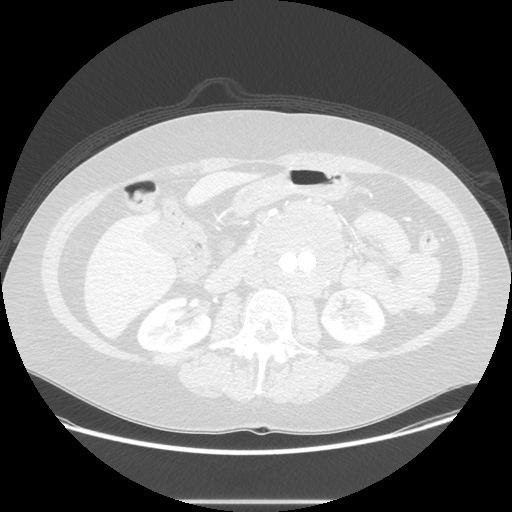
[im 133/186  soft-tissue]
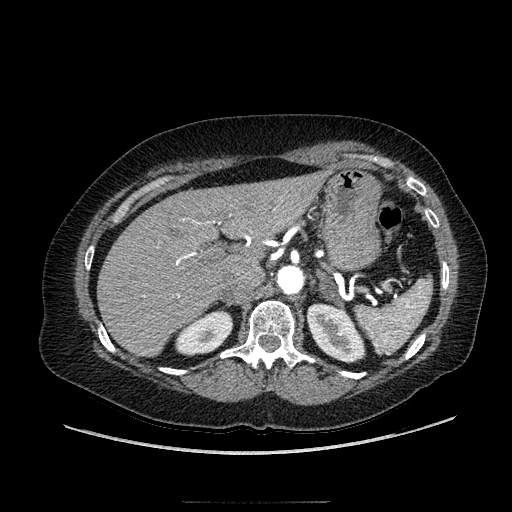
[im 133/186  lung]
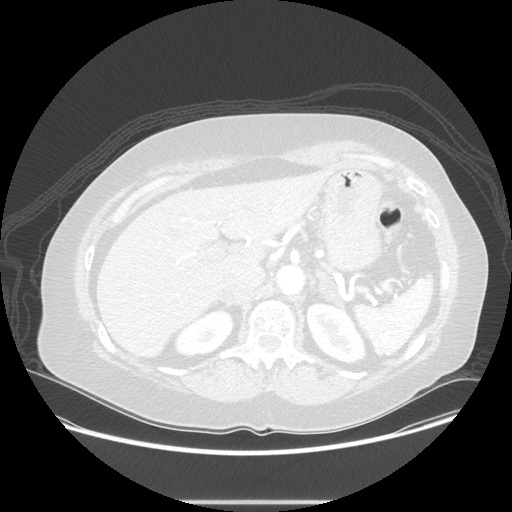
[im 159/186  soft-tissue]
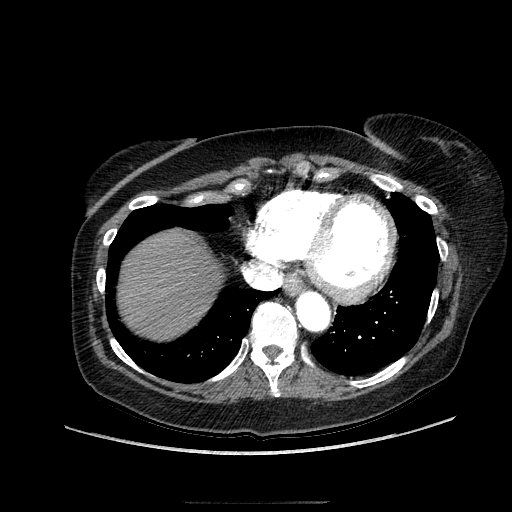
[im 159/186  lung]
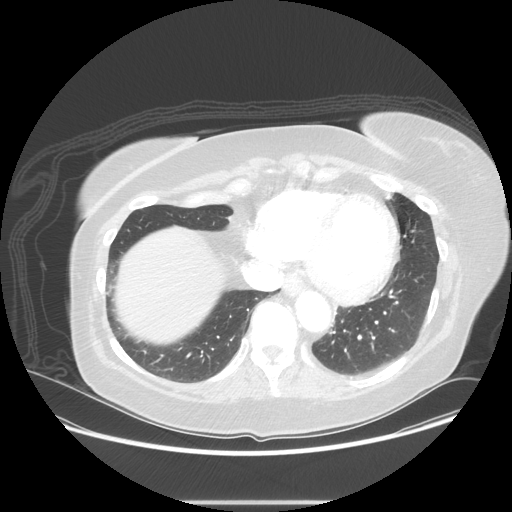

[Series 602: sagittal body · sagittal · 0.90mm/px · 5 of 167 slices shown (1 of 2)]
[im 28/167  soft-tissue]
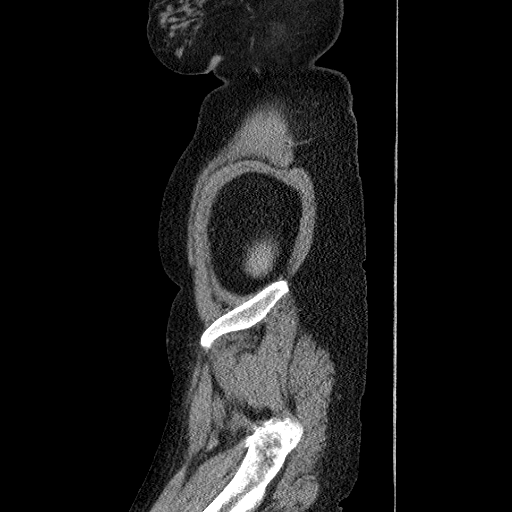
[im 56/167  soft-tissue]
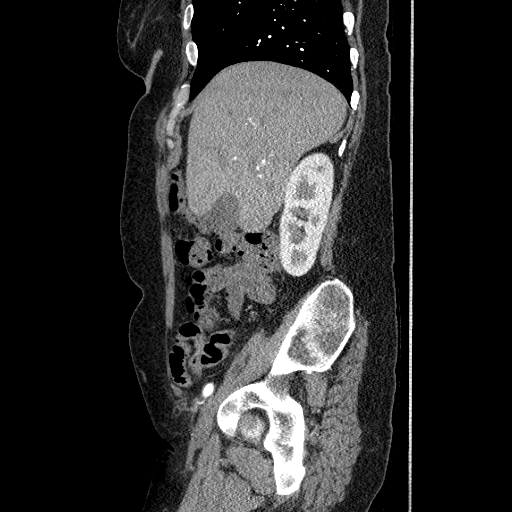
[im 84/167  soft-tissue]
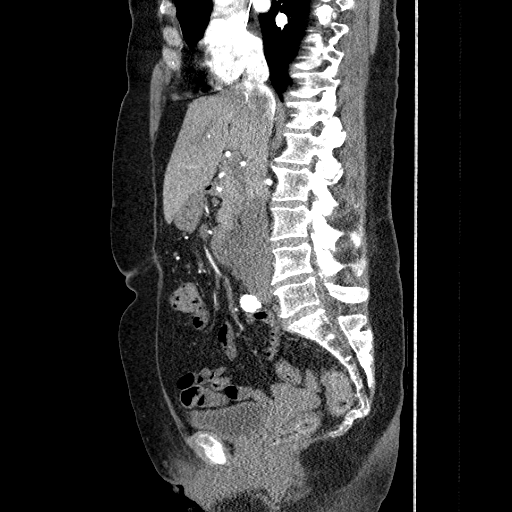
[im 111/167  soft-tissue]
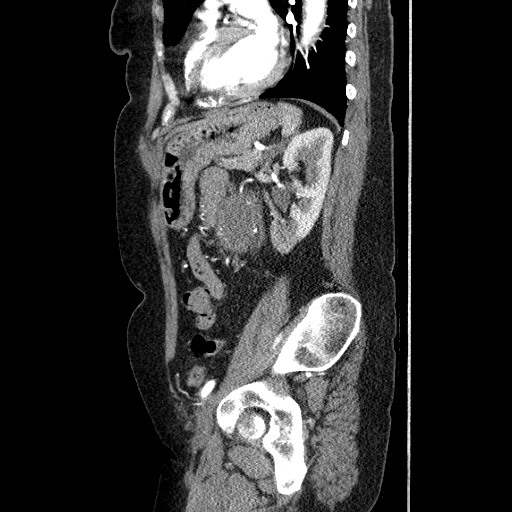
[im 139/167  soft-tissue]
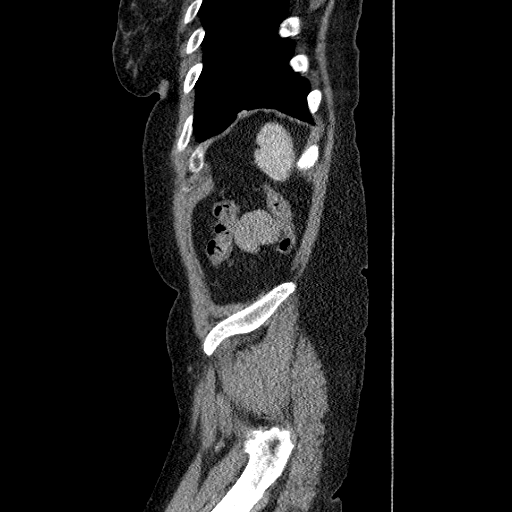

[Series 607: sagittal body · sagittal · 0.81mm/px · 2 of 167 slices shown (2 of 2)]
[im 28/167  soft-tissue]
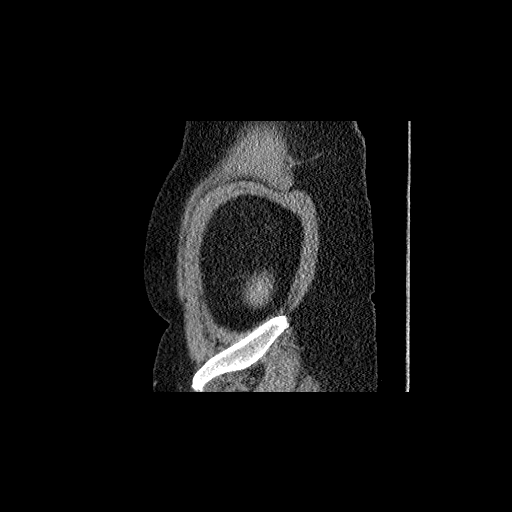
[im 56/167  soft-tissue]
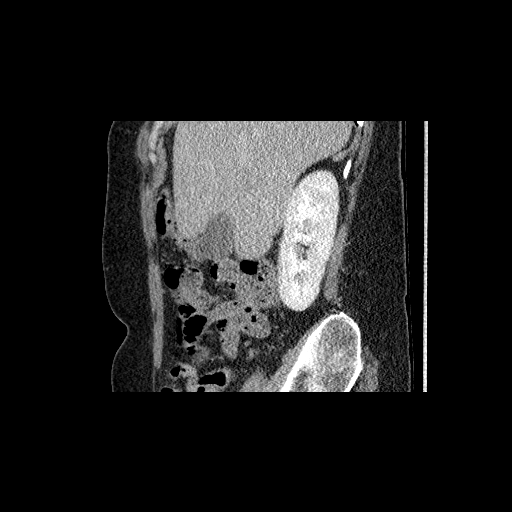

[13 of 36 positions shown; findings below may reference images not displayed]

FINDINGS: After endograft placement, aneurysm sac size surrounding the
endograft measures approximately 6.8 x 7.2 cm. This is similar to
dimensions of the aneurysm prior to treatment. There is a type 2
endoleak present which is very well delineated on the arterial
phase. This is a posterior endoleak predominantly supplied by the
left L4 lumbar artery but also likely with contributory flow via the
right L4 lumbar artery. The endoleak is predominantly in the right
posterior aspect of the inferior aneurysm sac.

The endograft appears well positioned with normal patency and normal
proximal and distal apposition. There is no evidence of type 1
endoleak. The celiac axis, superior mesenteric artery and bilateral
single renal arteries are normally patent. Iliac arteries distal to
the endograft show normal patency. The common femoral arteries and
femoral bifurcations are normally patent bilaterally.

No solid organ abnormalities are seen. Bowel loops appear
unremarkable. No abnormal fluid collections. No hernias or masses
identified. The bladder is decompressed. Bony structures are
unremarkable.

Review of the MIP images confirms the above findings.
IMPRESSION: Type 2 endoleak after SAGE supplied by L4 lumbar arteries into a
posterior endoleak. Supply from the left L4 lumbar artery appears
more prominent than the right. The endograft appears well
positioned. Largely thrombosed aneurysm sac surrounding the
endograft is stable in size, measuring 6.8 x 7.2 cm.

## 2017-01-15 ENCOUNTER — Encounter: Payer: Self-pay | Admitting: Internal Medicine

## 2017-01-26 ENCOUNTER — Encounter: Payer: Medicare HMO | Admitting: Internal Medicine

## 2017-10-15 ENCOUNTER — Ambulatory Visit: Payer: Medicare HMO | Admitting: Family

## 2017-10-15 ENCOUNTER — Other Ambulatory Visit (HOSPITAL_COMMUNITY): Payer: No Typology Code available for payment source

## 2017-12-04 ENCOUNTER — Ambulatory Visit: Payer: Medicare HMO | Admitting: Family

## 2017-12-04 ENCOUNTER — Ambulatory Visit (HOSPITAL_COMMUNITY)
Admission: RE | Admit: 2017-12-04 | Discharge: 2017-12-04 | Disposition: A | Discharge status: Home / Self Care | Payer: Medicare HMO | Source: Ambulatory Visit | Attending: Vascular Surgery | Admitting: Vascular Surgery

## 2017-12-04 ENCOUNTER — Encounter: Payer: Self-pay | Admitting: Family

## 2017-12-04 VITALS — BP 147/89 | HR 66 | Temp 98.0°F | Resp 16 | Ht 66.0 in | Wt 158.0 lb

## 2017-12-04 DIAGNOSIS — F17200 Nicotine dependence, unspecified, uncomplicated: Secondary | ICD-10-CM

## 2017-12-04 DIAGNOSIS — I714 Abdominal aortic aneurysm, without rupture, unspecified: Secondary | ICD-10-CM

## 2017-12-04 DIAGNOSIS — R0989 Other specified symptoms and signs involving the circulatory and respiratory systems: Secondary | ICD-10-CM

## 2017-12-04 DIAGNOSIS — Z95828 Presence of other vascular implants and grafts: Secondary | ICD-10-CM | POA: Diagnosis not present

## 2017-12-04 NOTE — Patient Instructions (Signed)
Before your next abdominal ultrasound:  Avoid gas forming foods and beverages the day before the test.   Take two Extra-Strength Gas-X capsules at bedtime the night before the test. Take another two Extra-Strength Gas-X capsules in the middle of the night if you get up to the restroom, if not, first thing in the morning with water.  Do not chew gum.     

## 2017-12-04 NOTE — Progress Notes (Signed)
[facility] [place]  CC: Follow up s/p Endovascular Repair of Abdominal Aortic Aneurysm    History of Present Illness  Gloria Morrison is a 67 y.o. ([birth year]) female who is s/p endovascular aneurysm repair of abdominal aortic aneurysm June 2016 by Dr. Oneida Alar.   Dr. Oneida Alar last evaluated pt on 10-09-17. At that time ultrasound of her abdominal aorta on 10/07/2016 showed that the aortic diameter was  4.1 cm decreased from 7 cm preoperatively. Doing well status post endovascular aneurysm repair of abdominal aortic aneurysm. Continued decrease in size of her aortic diameter. Patient was to follow-up in one year with our nurse practitioner an aortic ultrasound at that time.  She states her blood pressure at home is 130's/60-70.   She denies back pain or abdominal pain.  She denies claudication type symptoms with walking.  She denies any known history of stroke or TIA.    Diabetic: No Tobaccos use: current smoker  (1/4 ppd, cut back from 2+ ppd, started in her 11's yrs)   Past Medical History:  Diagnosis Date  . AAA (abdominal aortic aneurysm) without rupture ([place]) 07/06/2014  . Complication of anesthesia    hard to wake up (pt does not want Versed)  . Cough    allergies  . Essential hypertension    takes Lisinopril daily  . GERD (gastroesophageal reflux disease)    once in a while-will take OTC meds if needed  . Headache    occasionally  . History of bronchitis    a yr ago  . Hyperlipidemia    borderline;was on meds but has been off x 12 yrs   . Insomnia    takes Lorazepam as needed  . Microscopic hematuria   . URI (upper respiratory infection)    Jan 2016   Past Surgical History:  Procedure Laterality Date  . ABDOMINAL AORTIC ENDOVASCULAR STENT GRAFT N/A 07/19/2014   Procedure: ABDOMINAL AORTIC ENDOVASCULAR STENT GRAFT;  Surgeon: Elam Dutch, MD;  Location: [facility];  Service: Vascular;  Laterality: N/A;  . BREAST FIBROADENOMA SURGERY Left 1986   lumpectomy  . TONSILLECTOMY  as a child   Social History Social History   Tobacco Use  . Smoking status: Light Tobacco Smoker    Packs/day: 0.25    Types: Cigarettes    Start date: 02/18/1991    Last attempt to quit: 03/20/2016    Years since quitting: 1.7  . Smokeless tobacco: Never Used  Substance Use Topics  . Alcohol use: Yes    Comment: 5-6 drinks per week  . Drug use: No   Family History Family History  Problem Relation Age of Onset  . Melanoma Father   . Hypertension Sister   . Crohn's disease Brother    Current Outpatient Medications on File Prior to Visit  Medication Sig Dispense Refill  . amLODipine (NORVASC) 5 MG tablet Take 1 tablet by mouth daily.    Marland Kitchen aspirin EC 81 MG tablet Take 81 mg by mouth daily.     . calcium carbonate 1250 MG capsule Take 1,250 mg by mouth.    . Coenzyme Q10 (CO Q 10) 10 MG CAPS Take 100 mg by mouth.    . estradiol (ESTRACE) 0.1 MG/GM vaginal cream Place 1 Applicatorful vaginally once a week.     Marland Kitchen LORazepam (ATIVAN) 1 MG tablet Take 1 mg by mouth as needed.    Marland Kitchen losartan (COZAAR) 100 MG tablet Take 100 mg by mouth daily.    . Multiple  Vitamins-Minerals (ONE DAILY PLUS MINERALS) TABS Take 1 tablet by mouth.     . pantoprazole (PROTONIX) 40 MG tablet Take 40 mg by mouth daily.     No current facility-administered medications on file prior to visit.    Allergies  Allergen Reactions  . Tramadol Diarrhea     ROS: See HPI for pertinent positives and negatives.  Physical Examination  Vitals:   12/04/17 1042  BP: (!) 147/89  Pulse: 66  Resp: 16  Temp: 98 F (36.7 C)  TempSrc: Oral  SpO2: 98%  Weight: 158 lb (71.7 kg)  Height: 5\' 6"  (1.676 m)   Body mass index is 25.5 kg/m.  General: A&O x 3, WD, female HEENT: No gross abnormalities  Pulmonary: Sym exp, respirations are non labored, limited air movement in all bilateral posterior fields, right more so than left, CTAB, no rales, rhonchi, or wheezes. Cardiac: Regular rhythm  and rate, no murmur appreciated  Vascular: Vessel Right Left  Radial 2+Palpable 2+Palpable  Carotid  without bruit  without bruit  Aorta Not palpable N/A  Femoral 3+Palpable 3+Palpable  Popliteal 3+ palpable 2+ palpable  PT 2+Palpable 2+Palpable  DP 2+Palpable 2+Palpable   Gastrointestinal: soft, NTND, -G/R, - HSM, - palpable masses, - CVAT B. Musculoskeletal: M/S 5/5 throughout, extremities without ischemic changes  Skin: No rashes, no ulcers, no cellulitis.  Excessive facial wrinkling.  Neurologic: Pain and light touch intact in extremities, Motor exam as listed above. Psychiatric: Normal thought content, mood appropriate for clinical situation.     DATA  EVAR Duplex   Previous (Date: 10-07-16) AAA sac size: 4.1 cm; Right CIA: 1.3 cm; Left CIA: 1.5 cm  Current (Date: 12-04-17)  AAA sac size: 3.5 cm; Right CIA: 1.6 cm; Left CIA: 1.6 cm  no endoleak detected  CTA Abd/Pelvis Duplex (Date: 01-25-15)  AAA sac size: 5.8 cm   Medical Decision Making  Gloria Morrison is a 67 y.o. female who presents s/p EVAR (Date: June 2016).  Pt is asymptomatic with a decrease in sac size  to 3.5 cm today from 4.1 cm on 09-18-16. Slight increase in diameter of right CIA to 1.6 cm from 1.3 cm. No endoleak.   Over 3 minutes was spent counseling patient re smoking cessation, and patient was given several free resources re smoking cessation.    I discussed with the patient the importance of surveillance of the endograft.  The next endograft duplex will be scheduled for 12 months. Will also add duplex of popliteal arteries to evaluate for popliteal aneurysms: prominent popliteal pulses, no claudication.   The patient will follow up with [place] in 12 months with these studies.  I emphasized the importance of maximal medical management including strict control of blood pressure, blood glucose, and lipid levels, antiplatelet agents, obtaining regular exercise, and cessation of smoking.   Thank you  for allowing [place] to participate in this patient's care.  Clemon Chambers, RN, MSN, FNP-C [facility] [place] Office: [phone]  Clinic Physician: Donzetta Matters  12/04/2017, 11:01 AM

## 2017-12-05 ENCOUNTER — Encounter: Payer: Self-pay | Admitting: Family

## 2018-03-21 DIAGNOSIS — K219 Gastro-esophageal reflux disease without esophagitis: Secondary | ICD-10-CM | POA: Insufficient documentation

## 2018-06-18 DEATH — deceased

## 2018-07-19 DIAGNOSIS — K56609 Unspecified intestinal obstruction, unspecified as to partial versus complete obstruction: Secondary | ICD-10-CM

## 2018-07-19 HISTORY — DX: Unspecified intestinal obstruction, unspecified as to partial versus complete obstruction: K56.609

## 2018-08-09 ENCOUNTER — Emergency Department (HOSPITAL_COMMUNITY): Payer: Medicare HMO

## 2018-08-09 ENCOUNTER — Encounter (HOSPITAL_COMMUNITY): Payer: Self-pay | Admitting: Emergency Medicine

## 2018-08-09 ENCOUNTER — Inpatient Hospital Stay (HOSPITAL_COMMUNITY)
Admission: EM | Admit: 2018-08-09 | Discharge: 2018-08-16 | DRG: 390 | Disposition: A | Discharge status: Home / Self Care | Payer: Medicare HMO | Attending: Internal Medicine | Admitting: Internal Medicine

## 2018-08-09 ENCOUNTER — Other Ambulatory Visit: Payer: Self-pay

## 2018-08-09 DIAGNOSIS — R1084 Generalized abdominal pain: Secondary | ICD-10-CM | POA: Diagnosis not present

## 2018-08-09 DIAGNOSIS — Z8249 Family history of ischemic heart disease and other diseases of the circulatory system: Secondary | ICD-10-CM | POA: Diagnosis not present

## 2018-08-09 DIAGNOSIS — K56600 Partial intestinal obstruction, unspecified as to cause: Principal | ICD-10-CM | POA: Diagnosis present

## 2018-08-09 DIAGNOSIS — K56609 Unspecified intestinal obstruction, unspecified as to partial versus complete obstruction: Secondary | ICD-10-CM | POA: Diagnosis not present

## 2018-08-09 DIAGNOSIS — G4700 Insomnia, unspecified: Secondary | ICD-10-CM | POA: Diagnosis present

## 2018-08-09 DIAGNOSIS — Z808 Family history of malignant neoplasm of other organs or systems: Secondary | ICD-10-CM

## 2018-08-09 DIAGNOSIS — E785 Hyperlipidemia, unspecified: Secondary | ICD-10-CM | POA: Diagnosis present

## 2018-08-09 DIAGNOSIS — Z885 Allergy status to narcotic agent status: Secondary | ICD-10-CM | POA: Diagnosis not present

## 2018-08-09 DIAGNOSIS — E876 Hypokalemia: Secondary | ICD-10-CM | POA: Diagnosis present

## 2018-08-09 DIAGNOSIS — F17210 Nicotine dependence, cigarettes, uncomplicated: Secondary | ICD-10-CM | POA: Diagnosis present

## 2018-08-09 DIAGNOSIS — I714 Abdominal aortic aneurysm, without rupture, unspecified: Secondary | ICD-10-CM | POA: Diagnosis present

## 2018-08-09 DIAGNOSIS — Z0189 Encounter for other specified special examinations: Secondary | ICD-10-CM

## 2018-08-09 DIAGNOSIS — Z8679 Personal history of other diseases of the circulatory system: Secondary | ICD-10-CM | POA: Diagnosis not present

## 2018-08-09 DIAGNOSIS — Z1159 Encounter for screening for other viral diseases: Secondary | ICD-10-CM | POA: Diagnosis not present

## 2018-08-09 DIAGNOSIS — I10 Essential (primary) hypertension: Secondary | ICD-10-CM | POA: Diagnosis present

## 2018-08-09 DIAGNOSIS — K219 Gastro-esophageal reflux disease without esophagitis: Secondary | ICD-10-CM | POA: Diagnosis present

## 2018-08-09 DIAGNOSIS — Z7982 Long term (current) use of aspirin: Secondary | ICD-10-CM

## 2018-08-09 LAB — URINALYSIS, ROUTINE W REFLEX MICROSCOPIC
Bilirubin Urine: NEGATIVE
Glucose, UA: NEGATIVE mg/dL
Ketones, ur: 5 mg/dL — AB
Nitrite: NEGATIVE
Protein, ur: NEGATIVE mg/dL
Specific Gravity, Urine: 1.015 (ref 1.005–1.030)
pH: 6 (ref 5.0–8.0)

## 2018-08-09 LAB — CBC
HCT: 40.6 % (ref 36.0–46.0)
Hemoglobin: 13.3 g/dL (ref 12.0–15.0)
MCH: 29.5 pg (ref 26.0–34.0)
MCHC: 32.8 g/dL (ref 30.0–36.0)
MCV: 90 fL (ref 80.0–100.0)
Platelets: 346 10*3/uL (ref 150–400)
RBC: 4.51 MIL/uL (ref 3.87–5.11)
RDW: 13.7 % (ref 11.5–15.5)
WBC: 10.4 10*3/uL (ref 4.0–10.5)
nRBC: 0 % (ref 0.0–0.2)

## 2018-08-09 LAB — COMPREHENSIVE METABOLIC PANEL
ALT: 17 U/L (ref 0–44)
AST: 22 U/L (ref 15–41)
Albumin: 3.8 g/dL (ref 3.5–5.0)
Alkaline Phosphatase: 75 U/L (ref 38–126)
Anion gap: 14 (ref 5–15)
BUN: 17 mg/dL (ref 8–23)
CO2: 23 mmol/L (ref 22–32)
Calcium: 9.5 mg/dL (ref 8.9–10.3)
Chloride: 95 mmol/L — ABNORMAL LOW (ref 98–111)
Creatinine, Ser: 0.55 mg/dL (ref 0.44–1.00)
GFR calc Af Amer: >60 mL/min (ref >60)
GFR calc non Af Amer: >60 mL/min (ref >60)
Glucose, Bld: 92 mg/dL (ref 70–99)
Potassium: 2.8 mmol/L — ABNORMAL LOW (ref 3.5–5.1)
Sodium: 132 mmol/L — ABNORMAL LOW (ref 135–145)
Total Bilirubin: 0.8 mg/dL (ref 0.3–1.2)
Total Protein: 7.1 g/dL (ref 6.5–8.1)

## 2018-08-09 LAB — LIPASE, BLOOD: Lipase: 26 U/L (ref 11–51)

## 2018-08-09 MED ORDER — MORPHINE SULFATE (PF) 4 MG/ML IV SOLN
4.0000 mg | Freq: Once | INTRAVENOUS | Status: DC
  Filled 2018-08-09: qty 1

## 2018-08-09 MED ORDER — POTASSIUM CHLORIDE CRYS ER 20 MEQ PO TBCR
40.0000 meq | EXTENDED_RELEASE_TABLET | Freq: Once | ORAL | Status: AC
  Administered 2018-08-09: 40 meq via ORAL
  Filled 2018-08-09: qty 2

## 2018-08-09 MED ORDER — PANTOPRAZOLE SODIUM 40 MG IV SOLR
40.00 | INTRAVENOUS | Status: DC
Start: 2018-08-08 — End: 2018-08-09

## 2018-08-09 MED ORDER — KCL IN DEXTROSE-NACL 20-5-0.9 MEQ/L-%-% IV SOLN
INTRAVENOUS | Status: AC
  Administered 2018-08-10 (×2): via INTRAVENOUS
  Filled 2018-08-09 (×3): qty 1000

## 2018-08-09 MED ORDER — KCL IN DEXTROSE-NACL 20-5-0.9 MEQ/L-%-% IV SOLN
INTRAVENOUS | Status: DC
Start: ? — End: 2018-08-09

## 2018-08-09 MED ORDER — ONDANSETRON HCL 4 MG/2ML IJ SOLN
4.0000 mg | Freq: Once | INTRAMUSCULAR | Status: DC
  Filled 2018-08-09: qty 2

## 2018-08-09 MED ORDER — IOHEXOL 300 MG/ML  SOLN
100.0000 mL | Freq: Once | INTRAMUSCULAR | Status: AC | PRN
  Administered 2018-08-09: 100 mL via INTRAVENOUS

## 2018-08-09 MED ORDER — HYDRALAZINE HCL 20 MG/ML IJ SOLN
10.00 | INTRAMUSCULAR | Status: DC
Start: ? — End: 2018-08-09

## 2018-08-09 MED ORDER — ENOXAPARIN SODIUM 40 MG/0.4ML ~~LOC~~ SOLN
40.00 | SUBCUTANEOUS | Status: DC
Start: 2018-08-08 — End: 2018-08-09

## 2018-08-09 MED ORDER — LORAZEPAM 2 MG/ML IJ SOLN
0.50 | INTRAMUSCULAR | Status: DC
Start: ? — End: 2018-08-09

## 2018-08-09 MED ORDER — POTASSIUM CHLORIDE 10 MEQ/100ML IV SOLN
10.0000 meq | Freq: Once | INTRAVENOUS | Status: AC
  Administered 2018-08-09: 10 meq via INTRAVENOUS
  Filled 2018-08-09: qty 100

## 2018-08-09 MED ORDER — GENERIC EXTERNAL MEDICATION
5.00 | Status: DC
Start: ? — End: 2018-08-09

## 2018-08-09 MED ORDER — ONDANSETRON HCL 4 MG/2ML IJ SOLN
4.00 | INTRAMUSCULAR | Status: DC
Start: ? — End: 2018-08-09

## 2018-08-09 MED ORDER — GENERIC EXTERNAL MEDICATION
5.00 | Status: DC
Start: 2018-08-08 — End: 2018-08-09

## 2018-08-09 MED ORDER — SODIUM CHLORIDE 0.9% FLUSH: 3.0000 mL | Freq: Once | INTRAVENOUS | Status: DC

## 2018-08-09 MED ORDER — MORPHINE SULFATE 4 MG/ML IJ SOLN
4.00 | INTRAMUSCULAR | Status: DC
Start: ? — End: 2018-08-09

## 2018-08-09 MED ORDER — NICOTINE 14 MG/24HR TD PT24
1.00 | MEDICATED_PATCH | TRANSDERMAL | Status: DC
Start: 2018-08-09 — End: 2018-08-09

## 2018-08-09 MED ORDER — NICOTINE 7 MG/24HR TD PT24
7.0000 mg | MEDICATED_PATCH | Freq: Once | TRANSDERMAL | Status: AC
  Administered 2018-08-09: 7 mg via TRANSDERMAL
  Filled 2018-08-09: qty 1

## 2018-08-09 MED ORDER — SODIUM CHLORIDE (PF) 0.9 % IJ SOLN
INTRAMUSCULAR | Status: AC
  Filled 2018-08-09: qty 50

## 2018-08-09 NOTE — ED Provider Notes (Signed)
[facility] DEPT Provider Note   CSN: [id number] Arrival date & time: 08/09/18  1754    History   Chief Complaint Chief Complaint  Patient presents with   Abdominal Pain    HPI Gloria Morrison is a 68 y.o. female positive history of AAA, essential hypertension, GERD who presents for evaluation of abdominal pain, constipation.  She was recently admitted to [facility] regional for small bowel obstruction.  At that time, they had put an NG tube and were quietly observing her.  No surgical intervention indicated.  Patient reports that she had started passing gas.  Per patient, she was discharged on 08/08/2018 with plans for close follow-up.  Patient states she comes in today because she continues to have pain and feels abdominal bloating.  She feels like her symptoms are not getting worse.  She is still not had a bowel movement.  She states she is not vomiting.  Patient states that she comes in today because "she needs something to be done about her bowel obstruction."  She denies any fevers.  She denies any chest pain, difficulty breathing.     The history is provided by the patient.    Past Medical History:  Diagnosis Date   AAA (abdominal aortic aneurysm) without rupture ([place]) 1/42/3953   Complication of anesthesia    hard to wake up (pt does not want Versed)   Cough    allergies   Essential hypertension    takes Lisinopril daily   GERD (gastroesophageal reflux disease)    once in a while-will take OTC meds if needed   Headache    occasionally   History of bronchitis    a yr ago   Hyperlipidemia    borderline;was on meds but has been off x 12 yrs    Insomnia    takes Lorazepam as needed   Microscopic hematuria    URI (upper respiratory infection)    Jan 2016    Patient Active Problem List   Diagnosis Date Noted   SBO (small bowel obstruction) ([place]) 08/09/2018   HTN (hypertension) 08/09/2018   Hypokalemia 08/09/2018   AAA  (abdominal aortic aneurysm) ([place]) 07/19/2014   AAA (abdominal aortic aneurysm) without rupture ([place]) 07/06/2014    Past Surgical History:  Procedure Laterality Date   ABDOMINAL AORTIC ENDOVASCULAR STENT GRAFT N/A 07/19/2014   Procedure: ABDOMINAL AORTIC ENDOVASCULAR STENT GRAFT;  Surgeon: Elam Dutch, MD;  Location: [facility];  Service: Vascular;  Laterality: N/A;   BREAST FIBROADENOMA SURGERY Left 1986   lumpectomy   TONSILLECTOMY  as a child     OB History   No obstetric history on file.      Home Medications    Prior to Admission medications   Medication Sig Start Date End Date Taking? Authorizing Provider  amLODipine (NORVASC) 5 MG tablet Take 1 tablet by mouth daily. 02/12/16  Yes [provider]  aspirin EC 81 MG tablet Take 81 mg by mouth daily.    Yes [provider]  calcium carbonate 1250 MG capsule Take 1,250 mg by mouth.   Yes [provider]  Coenzyme Q10 (CO Q 10) 10 MG CAPS Take 100 mg by mouth.   Yes [provider]  estradiol (ESTRACE) 0.1 MG/GM vaginal cream Place 1 Applicatorful vaginally once a week.  02/16/14  Yes [provider]  LORazepam (ATIVAN) 1 MG tablet Take 1 mg by mouth as needed for anxiety.  07/17/16  Yes [provider]  Multiple Vitamins-Minerals (ONE DAILY PLUS MINERALS) TABS Take 1 tablet by mouth.    Yes [provider]  pantoprazole (PROTONIX) 40 MG tablet Take 40 mg by mouth daily. 05/16/16  Yes [provider]    Family History Family History  Problem Relation Age of Onset   Melanoma Father    Hypertension Sister    Crohn's disease Brother     Social History Social History   Tobacco Use   Smoking status: Light Tobacco Smoker    Packs/day: 0.25    Types: Cigarettes    Start date: 02/18/1991    Last attempt to quit: 03/20/2016    Years since quitting: 2.3   Smokeless tobacco: Never Used  Substance Use Topics   Alcohol use: Yes    Comment: 5-6 drinks  per week   Drug use: No     Allergies   Tramadol   Review of Systems Review of Systems  Constitutional: Negative for fever.  Respiratory: Negative for cough and shortness of breath.   Cardiovascular: Negative for chest pain.  Gastrointestinal: Positive for abdominal distention, abdominal pain and constipation. Negative for nausea and vomiting.  Genitourinary: Negative for dysuria and hematuria.  Neurological: Negative for headaches.  All other systems reviewed and are negative.    Physical Exam Updated Vital Signs BP (!) 179/92    Pulse 62    Temp 98.2 F (36.8 C) (Oral)    Resp 16    SpO2 97%   Physical Exam Vitals signs and nursing note reviewed.  Constitutional:      Appearance: Normal appearance. She is well-developed.  HENT:     Head: Normocephalic and atraumatic.  Eyes:     General: Lids are normal.     Conjunctiva/sclera: Conjunctivae normal.     Pupils: Pupils are equal, round, and reactive to light.  Neck:     Musculoskeletal: Full passive range of motion without pain.  Cardiovascular:     Rate and Rhythm: Normal rate and regular rhythm.     Pulses: Normal pulses.     Heart sounds: Normal heart sounds. No murmur. No friction rub. No gallop.   Pulmonary:     Effort: Pulmonary effort is normal.     Breath sounds: Normal breath sounds.  Abdominal:     General: Bowel sounds are decreased.     Palpations: Abdomen is soft. Abdomen is not rigid.     Tenderness: There is generalized abdominal tenderness. There is no guarding.     Comments: Tenderness palpation noted diffusely throughout the abdomen with no focal point.  Musculoskeletal: Normal range of motion.  Skin:    General: Skin is warm and dry.     Capillary Refill: Capillary refill takes less than 2 seconds.  Neurological:     Mental Status: She is alert and oriented to person, place, and time.  Psychiatric:        Speech: Speech normal.      ED Treatments / Results  Labs (all labs ordered are  listed, but only abnormal results are displayed) Labs Reviewed  COMPREHENSIVE METABOLIC PANEL - Abnormal; Notable for the following components:      Result Value   Sodium 132 (*)    Potassium 2.8 (*)    Chloride 95 (*)    All other components within normal limits  URINALYSIS, ROUTINE W REFLEX MICROSCOPIC - Abnormal; Notable for the following components:   Hgb urine dipstick SMALL (*)    Ketones, ur 5 (*)    Leukocytes,Ua TRACE (*)  Bacteria, UA FEW (*)    All other components within normal limits  SARS CORONAVIRUS 2 (HOSPITAL ORDER, [facility])  LIPASE, BLOOD  CBC    EKG None  Radiology Ct Abdomen Pelvis W Contrast  Result Date: 08/09/2018 CLINICAL DATA:  Persistent abdominal pain since discharged 3 days ago for small bowel obstruction. EXAM: CT ABDOMEN AND PELVIS WITH CONTRAST TECHNIQUE: Multidetector CT imaging of the abdomen and pelvis was performed using the standard protocol following bolus administration of intravenous contrast. CONTRAST:  174mL OMNIPAQUE IOHEXOL 300 MG/ML  SOLN COMPARISON:  CT abdomen pelvis dated August 05, 2018. FINDINGS: Lower chest: No acute abnormality. New subsegmental atelectasis in the right lower lobe. Unchanged scarring at the left lung base. Hepatobiliary: No focal liver abnormality is seen. No gallstones, gallbladder wall thickening, or biliary dilatation. Pancreas: Mild atrophy. No ductal dilatation or surrounding inflammatory changes. Spleen: Normal in size without focal abnormality. Adrenals/Urinary Tract: Unchanged small bilateral adrenal adenomas. No renal calculi, focal lesion, or hydronephrosis. The bladder is unremarkable. Stomach/Bowel: Persistent diffuse small bowel dilatation to the level of the mid ileum, with transition point in the right lower quadrant (series 2, image 54; series 5, image 56). Mild edema in the small bowel mesentery is unchanged. No pneumatosis. The distal ileum and colon are decompressed. Mild  left-sided colonic diverticulosis again noted. Normal appendix. Vascular/Lymphatic: Prior stent graft repair of an infrarenal abdominal aortic aneurysm. Aortoiliac atherosclerotic vascular disease. No enlarged abdominal or pelvic lymph nodes. Reproductive: Uterus and bilateral adnexa are unremarkable. Other: Unchanged trace free fluid in the pelvis. No pneumoperitoneum. Musculoskeletal: No acute or significant osseous findings. IMPRESSION: 1. Persistent distal small bowel obstruction with transition point in the right lower quadrant. Electronically Signed   By: Titus Dubin M.D.   On: 08/09/2018 20:33    Procedures Procedures (including critical care time)  Medications Ordered in ED Medications  ondansetron (ZOFRAN) injection 4 mg (4 mg Intravenous Refused 08/09/18 1900)  morphine 4 MG/ML injection 4 mg (4 mg Intravenous Refused 08/09/18 1900)  sodium chloride (PF) 0.9 % injection (has no administration in time range)  nicotine (NICODERM CQ - dosed in mg/24 hr) patch 7 mg (7 mg Transdermal Patch Applied 08/09/18 2028)  dextrose 5 % and 0.9 % NaCl with KCl 20 mEq/L infusion (has no administration in time range)  iohexol (OMNIPAQUE) 300 MG/ML solution 100 mL (100 mLs Intravenous Contrast Given 08/09/18 2004)  potassium chloride 10 mEq in 100 mL IVPB (0 mEq Intravenous Stopped 08/09/18 2139)  potassium chloride SA (K-DUR) CR tablet 40 mEq (40 mEq Oral Given 08/09/18 2029)     Initial Impression / Assessment and Plan / ED Course  I have reviewed the triage vital signs and the nursing notes.  Pertinent labs & imaging results that were available during my care of the patient were reviewed by me and considered in my medical decision making (see chart for details).        68 year old female who presents today for evaluation of worsening abdominal pain, bloating, constipation.  Recently admitted to [facility] for small bowel obstruction.  Discharged yesterday.  No vomiting, fevers. Patient  is afebrile, non-toxic appearing, sitting comfortably on examination table. Vital signs reviewed and stable.   CBC shows no leukocytosis or anemia.  CMP shows potassium of 2.8.  Lipase unremarkable.  UA negative for any infectious etiology.  Per review of record, patient had NG tube placed.  She started passing gas and had a small bowel movement.  She was started on liquid diet and tolerated well with no vomiting.  Per record, stated that patient wanted to go home because of family issues.  General surgery had been consulted and recommended outpatient work-up as to why small bowel obstruction had happened.  Patient did not want to stay any longer.  She was scheduled for small bowel follow-through next week.  CBC shows no leukocytosis.  Lipase unremarkable.  UA negative for any infectious etiology.  CMP shows potassium of 2.8.  Will plan for repletion.  Otherwise unremarkable.  CT scan shows persistent distal small bowel obstruction with transition point in the right lower quadrant.  Plan for consult to surgery.  Discussed patient with Dr. Lucia Gaskins (Gen Surg).  Recommends medical admission with plans for surgery to evaluate tomorrow morning.  Discussed with patient.  She is agreeable to plan.  Discussed patient with Dr. Hal Hope (Hospitalist). Will admit.   Portions of this note were generated with [employer]. Dictation errors may occur despite best attempts at proofreading.    Final Clinical Impressions(s) / ED Diagnoses   Final diagnoses:  Small bowel obstruction [facility])    ED Discharge Orders    None       Desma Mcgregor 08/09/18 2355    Dorie Rank, MD 08/11/18 2019

## 2018-08-09 NOTE — ED Notes (Signed)
Recently discharged for SBO; pt verbalized "they did nothing." Continued abd pain.

## 2018-08-09 NOTE — ED Notes (Signed)
Patient transported to CT 

## 2018-08-09 NOTE — H&P (Signed)
History and Physical    Gloria Morrison UKG:[id number] DOB: 1950-02-21 DOA: 08/09/2018  PCP: Berkley Harvey, NP  Patient coming from: Home.  Chief Complaint: Abdominal pain.  HPI: Gloria Morrison is a 68 y.o. female with history of abdominal aortic aneurysm status post repair 4 years ago, hypertension, tobacco abuse, GERD presents to the ER after patient started experiencing worsening abdominal pain since discharge from the hospital.  Patient was admitted to [facility] on June 18 5 days ago for small bowel obstruction at another time NG tube was placed and patient was conservatively managed.  After patient symptoms improved and NG tube was discontinued patient was provided diet and following which patient went home.  Once patient reached home patient's abdominal pain started worsening diffuse with some bloating sensation.  Denies vomiting.  Prior to admission last week patient had vomiting.  Has not moved bowels for last 5 days and has not moved any flatus.  ED Course: In the ER patient's abdomen appears distended labs revealed potassium of 2.8 creatinine 1.5 hemoglobin 13.  CT scan of the abdomen shows persistent distal small bowel obstruction with transition point good on-call general surgeon Dr. Lucia Gaskins has been consulted and patient is being admitted for further management of bowel obstruction.    Review of Systems: As per HPI, rest all negative.   Past Medical History:  Diagnosis Date   AAA (abdominal aortic aneurysm) without rupture ([place]) 7/62/8315   Complication of anesthesia    hard to wake up (pt does not want Versed)   Cough    allergies   Essential hypertension    takes Lisinopril daily   GERD (gastroesophageal reflux disease)    once in a while-will take OTC meds if needed   Headache    occasionally   History of bronchitis    a yr ago   Hyperlipidemia    borderline;was on meds but has been off x 12 yrs    Insomnia    takes Lorazepam as  needed   Microscopic hematuria    URI (upper respiratory infection)    Jan 2016    Past Surgical History:  Procedure Laterality Date   ABDOMINAL AORTIC ENDOVASCULAR STENT GRAFT N/A 07/19/2014   Procedure: ABDOMINAL AORTIC ENDOVASCULAR STENT GRAFT;  Surgeon: Elam Dutch, MD;  Location: [place];  Service: Vascular;  Laterality: N/A;   BREAST FIBROADENOMA SURGERY Left 1986   lumpectomy   TONSILLECTOMY  as a child     reports that she has been smoking cigarettes. She started smoking about 27 years ago. She has been smoking about 0.25 packs per day. She has never used smokeless tobacco. She reports current alcohol use. She reports that she does not use drugs.  Allergies  Allergen Reactions   Tramadol Diarrhea    Other reaction(s): GI Upset (intolerance)    Family History  Problem Relation Age of Onset   Melanoma Father    Hypertension Sister    Crohn's disease Brother     Prior to Admission medications   Medication Sig Start Date End Date Taking? Authorizing Provider  amLODipine (NORVASC) 5 MG tablet Take 1 tablet by mouth daily. 02/12/16  Yes [provider]  aspirin EC 81 MG tablet Take 81 mg by mouth daily.    Yes [provider]  calcium carbonate 1250 MG capsule Take 1,250 mg by mouth.   Yes [provider]  Coenzyme Q10 (CO Q 10) 10 MG CAPS Take 100 mg by  mouth.   Yes [provider]  estradiol (ESTRACE) 0.1 MG/GM vaginal cream Place 1 Applicatorful vaginally once a week.  02/16/14  Yes [provider]  LORazepam (ATIVAN) 1 MG tablet Take 1 mg by mouth as needed for anxiety.  07/17/16  Yes [provider]  Multiple Vitamins-Minerals (ONE DAILY PLUS MINERALS) TABS Take 1 tablet by mouth.    Yes [provider]  pantoprazole (PROTONIX) 40 MG tablet Take 40 mg by mouth daily. 05/16/16  Yes [provider]    Physical Exam: Vitals:   08/09/18 1806 08/09/18 1940 08/09/18 2100 08/09/18 2130  BP:  (!) 134/103 (!) 155/88 (!) 176/99 (!) 179/92  Pulse: 95 71 62   Resp: 18 16 16    Temp: 98.2 F (36.8 C)     TempSrc: Oral     SpO2: 96% 94% 97%       Constitutional: Moderately built and nourished. Vitals:   08/09/18 1806 08/09/18 1940 08/09/18 2100 08/09/18 2130  BP: (!) 134/103 (!) 155/88 (!) 176/99 (!) 179/92  Pulse: 95 71 62   Resp: 18 16 16    Temp: 98.2 F (36.8 C)     TempSrc: Oral     SpO2: 96% 94% 97%    Eyes: Anicteric no pallor. ENMT: No discharge from the ears eyes nose or mouth. Neck: No mass felt.  No neck rigidity. Respiratory: No rhonchi or crepitations. Cardiovascular: S1-S2 heard. Abdomen: Distended bowel sounds no depression no guarding or rigidity.  No rebound tenderness. Musculoskeletal: No edema. Skin: No rash. Neurologic: Alert awake oriented to time place and person. Psychiatric: Appears normal.   Labs on Admission: I have personally reviewed following labs and imaging studies  CBC: Recent Labs  Lab 08/09/18 1859  WBC 10.4  HGB 13.3  HCT 40.6  MCV 90.0  PLT 191   Basic Metabolic Panel: Recent Labs  Lab 08/09/18 1859  NA 132*  K 2.8*  CL 95*  CO2 23  GLUCOSE 92  BUN 17  CREATININE 0.55  CALCIUM 9.5   GFR: CrCl cannot be calculated (Unknown ideal weight.). Liver Function Tests: Recent Labs  Lab 08/09/18 1859  AST 22  ALT 17  ALKPHOS 75  BILITOT 0.8  PROT 7.1  ALBUMIN 3.8   Recent Labs  Lab 08/09/18 1859  LIPASE 26   No results for input(s): AMMONIA in the last 168 hours. Coagulation Profile: No results for input(s): INR, PROTIME in the last 168 hours. Cardiac Enzymes: No results for input(s): CKTOTAL, CKMB, CKMBINDEX, TROPONINI in the last 168 hours. BNP (last 3 results) No results for input(s): PROBNP in the last 8760 hours. HbA1C: No results for input(s): HGBA1C in the last 72 hours. CBG: No results for input(s): GLUCAP in the last 168 hours. Lipid Profile: No results for input(s): CHOL, HDL, LDLCALC,  TRIG, CHOLHDL, LDLDIRECT in the last 72 hours. Thyroid Function Tests: No results for input(s): TSH, T4TOTAL, FREET4, T3FREE, THYROIDAB in the last 72 hours. Anemia Panel: No results for input(s): VITAMINB12, FOLATE, FERRITIN, TIBC, IRON, RETICCTPCT in the last 72 hours. Urine analysis:    Component Value Date/Time   COLORURINE YELLOW 08/09/2018 1814   APPEARANCEUR CLEAR 08/09/2018 1814   LABSPEC 1.015 08/09/2018 1814   PHURINE 6.0 08/09/2018 1814   GLUCOSEU NEGATIVE 08/09/2018 1814   HGBUR SMALL (A) 08/09/2018 1814   BILIRUBINUR NEGATIVE 08/09/2018 1814   KETONESUR 5 (A) 08/09/2018 1814   PROTEINUR NEGATIVE 08/09/2018 1814   UROBILINOGEN 0.2 07/18/2014 1042   NITRITE NEGATIVE 08/09/2018 1814  LEUKOCYTESUR TRACE (A) 08/09/2018 1814   Sepsis Labs: @LABRCNTIP (procalcitonin:4,lacticidven:4) )No results found for this or any previous visit (from the past 240 hour(s)).   Radiological Exams on Admission: Ct Abdomen Pelvis W Contrast  Result Date: 08/09/2018 CLINICAL DATA:  Persistent abdominal pain since discharged 3 days ago for small bowel obstruction. EXAM: CT ABDOMEN AND PELVIS WITH CONTRAST TECHNIQUE: Multidetector CT imaging of the abdomen and pelvis was performed using the standard protocol following bolus administration of intravenous contrast. CONTRAST:  166mL OMNIPAQUE IOHEXOL 300 MG/ML  SOLN COMPARISON:  CT abdomen pelvis dated August 05, 2018. FINDINGS: Lower chest: No acute abnormality. New subsegmental atelectasis in the right lower lobe. Unchanged scarring at the left lung base. Hepatobiliary: No focal liver abnormality is seen. No gallstones, gallbladder wall thickening, or biliary dilatation. Pancreas: Mild atrophy. No ductal dilatation or surrounding inflammatory changes. Spleen: Normal in size without focal abnormality. Adrenals/Urinary Tract: Unchanged small bilateral adrenal adenomas. No renal calculi, focal lesion, or hydronephrosis. The bladder is unremarkable.  Stomach/Bowel: Persistent diffuse small bowel dilatation to the level of the mid ileum, with transition point in the right lower quadrant (series 2, image 54; series 5, image 56). Mild edema in the small bowel mesentery is unchanged. No pneumatosis. The distal ileum and colon are decompressed. Mild left-sided colonic diverticulosis again noted. Normal appendix. Vascular/Lymphatic: Prior stent graft repair of an infrarenal abdominal aortic aneurysm. Aortoiliac atherosclerotic vascular disease. No enlarged abdominal or pelvic lymph nodes. Reproductive: Uterus and bilateral adnexa are unremarkable. Other: Unchanged trace free fluid in the pelvis. No pneumoperitoneum. Musculoskeletal: No acute or significant osseous findings. IMPRESSION: 1. Persistent distal small bowel obstruction with transition point in the right lower quadrant. Electronically Signed   By: Titus Dubin M.D.   On: 08/09/2018 20:33     Assessment/Plan Principal Problem:   SBO (small bowel obstruction) (HCC) Active Problems:   AAA (abdominal aortic aneurysm) without rupture ([facility])   HTN (hypertension)   Hypokalemia    1. Small bowel obstruction -we will keep patient n.p.o. IV fluids replace potassium pain medication.  General surgery has been consulted. 2. Hypokalemia likely from vomiting previously and also poor oral intake from bowel obstruction.  Replace recheck check magnesium. 3. Hypertension we will keep patient on PRN IV hydralazine since patient is n.p.o. 4. Abdominal aortic aneurysm status post repair.  CAT scan does not show anything acute.   DVT prophylaxis: SCDs. Code Status: Full code. Family Communication: Discussed with patient. Disposition Plan: Home. Consults called: General surgery. Admission status: Inpatient.   [name] MD [facility] Pager [phone].  If 7PM-7AM, please contact night-coverage [email or web address] Password [facility]  08/09/2018, 11:48 PM

## 2018-08-10 ENCOUNTER — Inpatient Hospital Stay (HOSPITAL_COMMUNITY): Payer: Medicare HMO

## 2018-08-10 LAB — CBC WITH DIFFERENTIAL/PLATELET
Abs Immature Granulocytes: 0.05 10*3/uL (ref 0.00–0.07)
Basophils Absolute: 0.1 10*3/uL (ref 0.0–0.1)
Basophils Relative: 1 %
Eosinophils Absolute: 0.1 10*3/uL (ref 0.0–0.5)
Eosinophils Relative: 1 %
HCT: 38 % (ref 36.0–46.0)
Hemoglobin: 12.9 g/dL (ref 12.0–15.0)
Immature Granulocytes: 1 %
Lymphocytes Relative: 21 %
Lymphs Abs: 1.9 10*3/uL (ref 0.7–4.0)
MCH: 30.8 pg (ref 26.0–34.0)
MCHC: 33.9 g/dL (ref 30.0–36.0)
MCV: 90.7 fL (ref 80.0–100.0)
Monocytes Absolute: 0.8 10*3/uL (ref 0.1–1.0)
Monocytes Relative: 9 %
Neutro Abs: 6.4 10*3/uL (ref 1.7–7.7)
Neutrophils Relative %: 67 %
Platelets: 299 10*3/uL (ref 150–400)
RBC: 4.19 MIL/uL (ref 3.87–5.11)
RDW: 13.6 % (ref 11.5–15.5)
WBC: 9.4 10*3/uL (ref 4.0–10.5)
nRBC: 0 % (ref 0.0–0.2)

## 2018-08-10 LAB — COMPREHENSIVE METABOLIC PANEL
ALT: 16 U/L (ref 0–44)
AST: 21 U/L (ref 15–41)
Albumin: 3.4 g/dL — ABNORMAL LOW (ref 3.5–5.0)
Alkaline Phosphatase: 68 U/L (ref 38–126)
Anion gap: 11 (ref 5–15)
BUN: 10 mg/dL (ref 8–23)
CO2: 21 mmol/L — ABNORMAL LOW (ref 22–32)
Calcium: 8.5 mg/dL — ABNORMAL LOW (ref 8.9–10.3)
Chloride: 101 mmol/L (ref 98–111)
Creatinine, Ser: 0.45 mg/dL (ref 0.44–1.00)
GFR calc Af Amer: >60 mL/min (ref >60)
GFR calc non Af Amer: >60 mL/min (ref >60)
Glucose, Bld: 141 mg/dL — ABNORMAL HIGH (ref 70–99)
Potassium: 3.3 mmol/L — ABNORMAL LOW (ref 3.5–5.1)
Sodium: 133 mmol/L — ABNORMAL LOW (ref 135–145)
Total Bilirubin: 0.6 mg/dL (ref 0.3–1.2)
Total Protein: 6.1 g/dL — ABNORMAL LOW (ref 6.5–8.1)

## 2018-08-10 LAB — CBG MONITORING, ED: Glucose-Capillary: 113 mg/dL — ABNORMAL HIGH (ref 70–99)

## 2018-08-10 LAB — SARS CORONAVIRUS 2 BY RT PCR (HOSPITAL ORDER, PERFORMED IN ~~LOC~~ HOSPITAL LAB): SARS Coronavirus 2: NEGATIVE

## 2018-08-10 LAB — GLUCOSE, CAPILLARY: Glucose-Capillary: 119 mg/dL — ABNORMAL HIGH (ref 70–99)

## 2018-08-10 MED ORDER — HYDROCORTISONE (PERIANAL) 2.5 % EX CREA: 1.0000 "application " | TOPICAL_CREAM | Freq: Four times a day (QID) | CUTANEOUS | Status: DC | PRN

## 2018-08-10 MED ORDER — POLYETHYLENE GLYCOL 3350 17 G PO PACK: 17.0000 g | PACK | Freq: Every day | ORAL | Status: DC | PRN

## 2018-08-10 MED ORDER — NICOTINE 7 MG/24HR TD PT24
7.0000 mg | MEDICATED_PATCH | Freq: Every day | TRANSDERMAL | Status: DC
  Administered 2018-08-10 – 2018-08-15 (×6): 7 mg via TRANSDERMAL
  Filled 2018-08-10 (×6): qty 1

## 2018-08-10 MED ORDER — ONDANSETRON HCL 4 MG/2ML IJ SOLN
4.0000 mg | Freq: Four times a day (QID) | INTRAMUSCULAR | Status: DC | PRN
  Administered 2018-08-10: 4 mg via INTRAVENOUS
  Filled 2018-08-10: qty 2

## 2018-08-10 MED ORDER — ACETAMINOPHEN 325 MG PO TABS: 650.0000 mg | ORAL_TABLET | Freq: Four times a day (QID) | ORAL | Status: DC | PRN

## 2018-08-10 MED ORDER — MORPHINE SULFATE (PF) 2 MG/ML IV SOLN
1.0000 mg | INTRAVENOUS | Status: DC | PRN
  Administered 2018-08-10 – 2018-08-13 (×6): 1 mg via INTRAVENOUS
  Filled 2018-08-10 (×6): qty 1

## 2018-08-10 MED ORDER — MUSCLE RUB 10-15 % EX CREA: 1.0000 "application " | TOPICAL_CREAM | CUTANEOUS | Status: DC | PRN

## 2018-08-10 MED ORDER — LIP MEDEX EX OINT: 1.0000 "application " | TOPICAL_OINTMENT | CUTANEOUS | Status: DC | PRN

## 2018-08-10 MED ORDER — ALUM & MAG HYDROXIDE-SIMETH 200-200-20 MG/5ML PO SUSP: 30.0000 mL | ORAL | Status: DC | PRN

## 2018-08-10 MED ORDER — POLYVINYL ALCOHOL 1.4 % OP SOLN: 1.0000 [drp] | OPHTHALMIC | Status: DC | PRN

## 2018-08-10 MED ORDER — GUAIFENESIN-DM 100-10 MG/5ML PO SYRP: 5.0000 mL | ORAL_SOLUTION | ORAL | Status: DC | PRN

## 2018-08-10 MED ORDER — DIPHENHYDRAMINE HCL 50 MG/ML IJ SOLN
12.5000 mg | Freq: Once | INTRAMUSCULAR | Status: DC
  Filled 2018-08-10: qty 1

## 2018-08-10 MED ORDER — HYDROCORTISONE 1 % EX CREA: 1.0000 "application " | TOPICAL_CREAM | Freq: Three times a day (TID) | CUTANEOUS | Status: DC | PRN

## 2018-08-10 MED ORDER — HYDRALAZINE HCL 20 MG/ML IJ SOLN
10.0000 mg | INTRAMUSCULAR | Status: DC | PRN
  Administered 2018-08-10 (×2): 10 mg via INTRAVENOUS
  Filled 2018-08-10 (×3): qty 1

## 2018-08-10 MED ORDER — ACETAMINOPHEN 650 MG RE SUPP: 650.0000 mg | Freq: Four times a day (QID) | RECTAL | Status: DC | PRN

## 2018-08-10 MED ORDER — ACETAMINOPHEN 325 MG PO TABS
650.0000 mg | ORAL_TABLET | Freq: Four times a day (QID) | ORAL | Status: DC | PRN
  Filled 2018-08-10: qty 2

## 2018-08-10 MED ORDER — SALINE SPRAY 0.65 % NA SOLN: 1.0000 | NASAL | Status: DC | PRN

## 2018-08-10 MED ORDER — DIATRIZOATE MEGLUMINE & SODIUM 66-10 % PO SOLN
90.0000 mL | Freq: Once | ORAL | Status: AC
  Administered 2018-08-10: 90 mL via NASOGASTRIC
  Filled 2018-08-10 (×2): qty 90

## 2018-08-10 MED ORDER — DIPHENHYDRAMINE HCL 12.5 MG/5ML PO ELIX: 25.0000 mg | ORAL_SOLUTION | Freq: Once | ORAL | Status: DC

## 2018-08-10 MED ORDER — PHENOL 1.4 % MT LIQD
1.0000 | OROMUCOSAL | Status: DC | PRN
  Filled 2018-08-10: qty 177

## 2018-08-10 MED ORDER — SENNOSIDES-DOCUSATE SODIUM 8.6-50 MG PO TABS: 2.0000 | ORAL_TABLET | Freq: Every evening | ORAL | Status: DC | PRN

## 2018-08-10 MED ORDER — LORATADINE 10 MG PO TABS: 10.0000 mg | ORAL_TABLET | Freq: Every day | ORAL | Status: DC | PRN

## 2018-08-10 MED ORDER — ONDANSETRON HCL 4 MG PO TABS: 4.0000 mg | ORAL_TABLET | Freq: Four times a day (QID) | ORAL | Status: DC | PRN

## 2018-08-10 NOTE — Progress Notes (Signed)
Received report from ED.  

## 2018-08-10 NOTE — ED Notes (Signed)
Attempted to call nursing report, informed not ready for pt at this time.

## 2018-08-10 NOTE — ED Notes (Signed)
ED TO INPATIENT HANDOFF REPORT  Name/Age/Gender Gloria Morrison 68 y.o. female  Code Status    Code Status Orders  (From admission, onward)         Start     Ordered   08/10/18 0020  Full code  Continuous     08/10/18 0019        Code Status History    Date Active Date Inactive Code Status Order ID Comments User Context   07/19/2014 1601 07/20/2014 1328 Full Code [id number]  Gloria Amor, PA-C Inpatient   Advance Care Planning Activity      Home/SNF/Other Home  Chief Complaint Abd Pain, vomiting  Level of Care/Admitting Diagnosis ED Disposition    ED Disposition Condition [facility]: [facility] [[id number]]  Level of Care: Telemetry [5]  Admit to tele based on following criteria: Monitor for Ischemic changes  Covid Evaluation: Screening Protocol (No Symptoms)  Diagnosis: SBO (small bowel obstruction) [facility]) [[id number]]  Admitting Physician: Gloria Morrison [phone]  Attending Physician: Gloria Morrison Lei.Right  Estimated length of stay: past midnight tomorrow  Certification:: I certify this patient will need inpatient services for at least 2 midnights  PT Class (Do Not Modify): Inpatient [101]  PT Acc Code (Do Not Modify): Private [1]       Medical History Past Medical History:  Diagnosis Date  . AAA (abdominal aortic aneurysm) without rupture ([place]) 07/06/2014  . Complication of anesthesia    hard to wake up (pt does not want Versed)  . Cough    allergies  . Essential hypertension    takes Lisinopril daily  . GERD (gastroesophageal reflux disease)    once in a while-will take OTC meds if needed  . Headache    occasionally  . History of bronchitis    a yr ago  . Hyperlipidemia    borderline;was on meds but has been off x 12 yrs   . Insomnia    takes Lorazepam as needed  . Microscopic hematuria   . URI (upper respiratory infection)    Jan 2016    Allergies Allergies  Allergen Reactions  . Tramadol  Diarrhea    Other reaction(s): GI Upset (intolerance)    IV Location/Drains/Wounds Patient Lines/Drains/Airways Status   Active Line/Drains/Airways    Name:   Placement date:   Placement time:   Site:   Days:   Peripheral IV 08/09/18 Left Antecubital   08/09/18    1857    Antecubital   1   NG/OG Tube Nasogastric Right nare Aucultation Measured external length of tube   08/10/18    0900    Right nare   less than 1   Incision (Closed) 07/19/14 Groin Bilateral   07/19/14    1030     1483          Labs/Imaging Results for orders placed or performed during the hospital encounter of 08/09/18 (from the past 48 hour(s))  Urinalysis, Routine w reflex microscopic     Status: Abnormal   Collection Time: 08/09/18  6:14 PM  Result Value Ref Range   Color, Urine YELLOW YELLOW   APPearance CLEAR CLEAR   Specific Gravity, Urine 1.015 1.005 - 1.030   pH 6.0 5.0 - 8.0   Glucose, UA NEGATIVE NEGATIVE mg/dL   Hgb urine dipstick SMALL (A) NEGATIVE   Bilirubin Urine NEGATIVE NEGATIVE   Ketones, ur 5 (A) NEGATIVE mg/dL   Protein, ur NEGATIVE NEGATIVE mg/dL  Nitrite NEGATIVE NEGATIVE   Leukocytes,Ua TRACE (A) NEGATIVE   RBC / HPF 11-20 0 - 5 RBC/hpf   WBC, UA 6-10 0 - 5 WBC/hpf   Bacteria, UA FEW (A) NONE SEEN   Squamous Epithelial / LPF 0-5 0 - 5   Mucus PRESENT     Comment: Performed at [facility], [place] [address] [place], [place] [address]  Lipase, blood     Status: None   Collection Time: 08/09/18  6:59 PM  Result Value Ref Range   Lipase 26 11 - 51 U/L    Comment: Performed at [facility], [place] [address] [place], [place] [address]  Comprehensive metabolic panel     Status: Abnormal   Collection Time: 08/09/18  6:59 PM  Result Value Ref Range   Sodium 132 (L) 135 - 145 mmol/L   Potassium 2.8 (L) 3.5 - 5.1 mmol/L   Chloride 95 (L) 98 - 111 mmol/L   CO2 23 22 - 32 mmol/L   Glucose, Bld 92 70 - 99 mg/dL   BUN 17 8 - 23 mg/dL   Creatinine, Ser  0.55 0.44 - 1.00 mg/dL   Calcium 9.5 8.9 - 10.3 mg/dL   Total Protein 7.1 6.5 - 8.1 g/dL   Albumin 3.8 3.5 - 5.0 g/dL   AST 22 15 - 41 U/L   ALT 17 0 - 44 U/L   Alkaline Phosphatase 75 38 - 126 U/L   Total Bilirubin 0.8 0.3 - 1.2 mg/dL   GFR calc non Af Amer >60 >60 mL/min   GFR calc Af Amer >60 >60 mL/min   Anion gap 14 5 - 15    Comment: Performed at [facility], [place] [address], [place], [place] [address]  CBC     Status: None   Collection Time: 08/09/18  6:59 PM  Result Value Ref Range   WBC 10.4 4.0 - 10.5 K/uL   RBC 4.51 3.87 - 5.11 MIL/uL   Hemoglobin 13.3 12.0 - 15.0 g/dL   HCT 40.6 36.0 - 46.0 %   MCV 90.0 80.0 - 100.0 fL   MCH 29.5 26.0 - 34.0 pg   MCHC 32.8 30.0 - 36.0 g/dL   RDW 13.7 11.5 - 15.5 %   Platelets 346 150 - 400 K/uL   nRBC 0.0 0.0 - 0.2 %    Comment: Performed at [facility], [place] [address], [place], [place] [address]  SARS Coronavirus 2 (CEPHEID - Performed in [facility] hospital lab), Hosp Order     Status: None   Collection Time: 08/10/18  1:53 AM   Specimen: Nasopharyngeal Swab  Result Value Ref Range   SARS Coronavirus 2 NEGATIVE NEGATIVE    Comment: (NOTE) If result is NEGATIVE SARS-CoV-2 target nucleic acids are NOT DETECTED. The SARS-CoV-2 RNA is generally detectable in upper and lower  respiratory specimens during the acute phase of infection. The lowest  concentration of SARS-CoV-2 viral copies this assay can detect is 250  copies / mL. A negative result does not preclude SARS-CoV-2 infection  and should not be used as the sole basis for treatment or other  patient management decisions.  A negative result may occur with  improper specimen collection / handling, submission of specimen other  than nasopharyngeal swab, presence of viral mutation(s) within the  areas targeted by this assay, and inadequate number of viral copies  (<250 copies / mL). A negative result must be combined with clinical   observations,  patient history, and epidemiological information. If result is POSITIVE SARS-CoV-2 target nucleic acids are DETECTED. The SARS-CoV-2 RNA is generally detectable in upper and lower  respiratory specimens dur ing the acute phase of infection.  Positive  results are indicative of active infection with SARS-CoV-2.  Clinical  correlation with patient history and other diagnostic information is  necessary to determine patient infection status.  Positive results do  not rule out bacterial infection or co-infection with other viruses. If result is PRESUMPTIVE POSTIVE SARS-CoV-2 nucleic acids MAY BE PRESENT.   A presumptive positive result was obtained on the submitted specimen  and confirmed on repeat testing.  While 2019 novel coronavirus  (SARS-CoV-2) nucleic acids may be present in the submitted sample  additional confirmatory testing may be necessary for epidemiological  and / or clinical management purposes  to differentiate between  SARS-CoV-2 and other Sarbecovirus currently known to infect humans.  If clinically indicated additional testing with an alternate test  methodology [phone]) is advised. The SARS-CoV-2 RNA is generally  detectable in upper and lower respiratory sp ecimens during the acute  phase of infection. The expected result is Negative. Fact Sheet for Patients:  StrictlyIdeas.no Fact Sheet for Healthcare Providers: BankingDealers.co.za This test is not yet approved or cleared by the [place] FDA and has been authorized for detection and/or diagnosis of SARS-CoV-2 by FDA under an Emergency Use Authorization (EUA).  This EUA will remain in effect (meaning this test can be used) for the duration of the COVID-19 declaration under Section 564(b)(1) of the Act, 21 U.S.C. section 360bbb-3(b)(1), unless the authorization is terminated or revoked sooner. Performed at [facility], [place]  [address], [place], [place] [address]   Comprehensive metabolic panel     Status: Abnormal   Collection Time: 08/10/18  6:30 AM  Result Value Ref Range   Sodium 133 (L) 135 - 145 mmol/L   Potassium 3.3 (L) 3.5 - 5.1 mmol/L   Chloride 101 98 - 111 mmol/L   CO2 21 (L) 22 - 32 mmol/L   Glucose, Bld 141 (H) 70 - 99 mg/dL   BUN 10 8 - 23 mg/dL   Creatinine, Ser 0.45 0.44 - 1.00 mg/dL   Calcium 8.5 (L) 8.9 - 10.3 mg/dL   Total Protein 6.1 (L) 6.5 - 8.1 g/dL   Albumin 3.4 (L) 3.5 - 5.0 g/dL   AST 21 15 - 41 U/L   ALT 16 0 - 44 U/L   Alkaline Phosphatase 68 38 - 126 U/L   Total Bilirubin 0.6 0.3 - 1.2 mg/dL   GFR calc non Af Amer >60 >60 mL/min   GFR calc Af Amer >60 >60 mL/min   Anion gap 11 5 - 15    Comment: Performed at [facility], [place] [address] [place], [place] [address]  CBC WITH DIFFERENTIAL     Status: None   Collection Time: 08/10/18  6:30 AM  Result Value Ref Range   WBC 9.4 4.0 - 10.5 K/uL   RBC 4.19 3.87 - 5.11 MIL/uL   Hemoglobin 12.9 12.0 - 15.0 g/dL   HCT 38.0 36.0 - 46.0 %   MCV 90.7 80.0 - 100.0 fL   MCH 30.8 26.0 - 34.0 pg   MCHC 33.9 30.0 - 36.0 g/dL   RDW 13.6 11.5 - 15.5 %   Platelets 299 150 - 400 K/uL   nRBC 0.0 0.0 - 0.2 %   Neutrophils Relative % 67 %   Neutro Abs 6.4 1.7 -  7.7 K/uL   Lymphocytes Relative 21 %   Lymphs Abs 1.9 0.7 - 4.0 K/uL   Monocytes Relative 9 %   Monocytes Absolute 0.8 0.1 - 1.0 K/uL   Eosinophils Relative 1 %   Eosinophils Absolute 0.1 0.0 - 0.5 K/uL   Basophils Relative 1 %   Basophils Absolute 0.1 0.0 - 0.1 K/uL   Immature Granulocytes 1 %   Abs Immature Granulocytes 0.05 0.00 - 0.07 K/uL    Comment: Performed at [facility], [place] [address] [place], [place] [address]  CBG monitoring, ED     Status: Abnormal   Collection Time: 08/10/18  7:43 AM  Result Value Ref Range   Glucose-Capillary 113 (H) 70 - 99 mg/dL   Ct Abdomen Pelvis W Contrast  Result Date: 08/09/2018 CLINICAL DATA:   Persistent abdominal pain since discharged 3 days ago for small bowel obstruction. EXAM: CT ABDOMEN AND PELVIS WITH CONTRAST TECHNIQUE: Multidetector CT imaging of the abdomen and pelvis was performed using the standard protocol following bolus administration of intravenous contrast. CONTRAST:  166mL OMNIPAQUE IOHEXOL 300 MG/ML  SOLN COMPARISON:  CT abdomen pelvis dated August 05, 2018. FINDINGS: Lower chest: No acute abnormality. New subsegmental atelectasis in the right lower lobe. Unchanged scarring at the left lung base. Hepatobiliary: No focal liver abnormality is seen. No gallstones, gallbladder wall thickening, or biliary dilatation. Pancreas: Mild atrophy. No ductal dilatation or surrounding inflammatory changes. Spleen: Normal in size without focal abnormality. Adrenals/Urinary Tract: Unchanged small bilateral adrenal adenomas. No renal calculi, focal lesion, or hydronephrosis. The bladder is unremarkable. Stomach/Bowel: Persistent diffuse small bowel dilatation to the level of the mid ileum, with transition point in the right lower quadrant (series 2, image 54; series 5, image 56). Mild edema in the small bowel mesentery is unchanged. No pneumatosis. The distal ileum and colon are decompressed. Mild left-sided colonic diverticulosis again noted. Normal appendix. Vascular/Lymphatic: Prior stent graft repair of an infrarenal abdominal aortic aneurysm. Aortoiliac atherosclerotic vascular disease. No enlarged abdominal or pelvic lymph nodes. Reproductive: Uterus and bilateral adnexa are unremarkable. Other: Unchanged trace free fluid in the pelvis. No pneumoperitoneum. Musculoskeletal: No acute or significant osseous findings. IMPRESSION: 1. Persistent distal small bowel obstruction with transition point in the right lower quadrant. Electronically Signed   By: Titus Dubin M.D.   On: 08/09/2018 20:33   Dg Abd Portable 1v-small Bowel Protocol-position Verification  Result Date: 08/10/2018 CLINICAL DATA:   Nasogastric tube placement. EXAM: PORTABLE ABDOMEN - 1 VIEW COMPARISON:  CT scan of August 09, 2018. Radiographs of August 06, 2018. FINDINGS: Distal tip of nasogastric tube is seen in expected position of proximal stomach. Increased small bowel dilatation is noted concerning for distal small bowel obstruction. Residual contrast is noted in nondilated colon. Status post stent graft repair of abdominal aortic aneurysm. IMPRESSION: Nasogastric tube tip seen in expected position of proximal stomach. Increased small bowel dilatation is noted concerning for distal small bowel obstruction. Electronically Signed   By: Marijo Conception M.D.   On: 08/10/2018 09:23    Pending Labs Unresulted Labs (From admission, onward)    Start     Ordered   08/11/18 0350  Basic metabolic panel  Daily,   R    Question:  Specimen collection method  Answer:  Lab=Lab collect   08/10/18 0841   08/11/18 0500  Magnesium  Daily,   R    Question:  Specimen collection method  Answer:  Lab=Lab collect   08/10/18 0841   08/11/18  0500  CBC  Daily,   R    Question:  Specimen collection method  Answer:  Lab=Lab collect   08/10/18 0841   08/10/18 0500  HIV antibody (Routine Testing)  Tomorrow morning,   R     08/10/18 0019          Vitals/Pain Today's Vitals   08/10/18 0931 08/10/18 0951 08/10/18 1000 08/10/18 1200  BP: (!) 161/86  (!) 174/83 (!) 151/139  Pulse: 60  60 62  Resp:    18  Temp:      TempSrc:      SpO2: 94%  93% 95%  PainSc:  8       Isolation Precautions No active isolations  Medications Medications  sodium chloride (PF) 0.9 % injection (has no administration in time range)  nicotine (NICODERM CQ - dosed in mg/24 hr) patch 7 mg (7 mg Transdermal Patch Applied 08/09/18 2028)  dextrose 5 % and 0.9 % NaCl with KCl 20 mEq/L infusion ( Intravenous New Bag/Given 08/10/18 0151)  acetaminophen (TYLENOL) tablet 650 mg (has no administration in time range)    Or  acetaminophen (TYLENOL) suppository 650 mg (has no  administration in time range)  ondansetron (ZOFRAN) tablet 4 mg (has no administration in time range)    Or  ondansetron (ZOFRAN) injection 4 mg (has no administration in time range)  hydrALAZINE (APRESOLINE) injection 10 mg (10 mg Intravenous Given 08/10/18 0502)  morphine 2 MG/ML injection 1 mg (1 mg Intravenous Given 08/10/18 0851)  diatrizoate meglumine-sodium (GASTROGRAFIN) 66-10 % solution 90 mL (has no administration in time range)  acetaminophen (TYLENOL) tablet 650 mg (has no administration in time range)  loratadine (CLARITIN) tablet 10 mg (has no administration in time range)  lip balm (CARMEX) ointment 1 application (has no administration in time range)  polyvinyl alcohol (LIQUIFILM TEARS) 1.4 % ophthalmic solution 1 drop (has no administration in time range)  hydrocortisone (ANUSOL-HC) 2.5 % rectal cream 1 application (has no administration in time range)  alum & mag hydroxide-simeth (MAALOX/MYLANTA) 200-200-20 MG/5ML suspension 30 mL (has no administration in time range)  hydrocortisone cream 1 % 1 application (has no administration in time range)  Muscle Rub CREA 1 application (has no administration in time range)  sodium chloride (OCEAN) 0.65 % nasal spray 1 spray (has no administration in time range)  phenol (CHLORASEPTIC) mouth spray 1 spray (has no administration in time range)  polyethylene glycol (MIRALAX / GLYCOLAX) packet 17 g (has no administration in time range)  senna-docusate (Senokot-S) tablet 2 tablet (has no administration in time range)  guaiFENesin-dextromethorphan (ROBITUSSIN DM) 100-10 MG/5ML syrup 5 mL (has no administration in time range)  iohexol (OMNIPAQUE) 300 MG/ML solution 100 mL (100 mLs Intravenous Contrast Given 08/09/18 2004)  potassium chloride 10 mEq in 100 mL IVPB (0 mEq Intravenous Stopped 08/09/18 2139)  potassium chloride SA (K-DUR) CR tablet 40 mEq (40 mEq Oral Given 08/09/18 2029)    Mobility walks

## 2018-08-10 NOTE — ED Notes (Signed)
X-ray at bedside

## 2018-08-10 NOTE — Progress Notes (Signed)
PROGRESS NOTE    Gloria Morrison  KWI:[id number] DOB: [birth year] DOA: 08/09/2018 PCP: Berkley Harvey, NP   Brief Narrative:  68 year old with history of GERD, tobacco use, essential hypertension, abdominal aortic aneurysm repair about 4 years ago came to the hospital with abdominal pain.  Patient was admitted to the hospital for recurrent distal small bowel obstruction.  She was just at [facility] with the same issue treated conservatively.   Assessment & Plan:   Principal Problem:   SBO (small bowel obstruction) (HCC) Active Problems:   AAA (abdominal aortic aneurysm) without rupture (HCC)   HTN (hypertension)   Hypokalemia  Distal small bowel obstruction with transition point in right lower quadrant Abdominal pain with nausea - Patient currently has an NG tube in place.  Spoke with general surgery, they are planning on conservative management.  Small bowel follow-through will be ordered.  Will monitor electrolytes closely.  May require surgical intervention if does not improve with conservative management. Possibly she was still partially obstructed from her previous episode. Currently n.p.o.  Essential hypertension - Amlodipine is currently on hold.  GERD -PPI been able to take oral.  History of abdominal aortic aneurysm without rupture -Status post endovascular repair.   VT prophylaxis: SCDs Code Status: Full code Family Communication: None at bedside Disposition Plan: Maintain hospital stay  Consultants:   General surgery  Procedures:    None Antimicrobials:   None   Subjective: Patient states she still has abdominal pain with feeling of nausea.  She is adamant that she wants surgical intervention but I have advised her that it is up to general surgery to decide this.  Review of Systems Otherwise negative except as per HPI, including: General: Denies fever, chills, night sweats or unintended weight loss. Resp: Denies cough, wheezing,  shortness of breath. Cardiac: Denies chest pain, palpitations, orthopnea, paroxysmal nocturnal dyspnea. GI: Denies diarrhea or constipation GU: Denies dysuria, frequency, hesitancy or incontinence MS: Denies muscle aches, joint pain or swelling Neuro: Denies headache, neurologic deficits (focal weakness, numbness, tingling), abnormal gait Psych: Denies anxiety, depression, SI/HI/AVH Skin: Denies new rashes or lesions ID: Denies sick contacts, exotic exposures, travel  Objective: Vitals:   08/10/18 0931 08/10/18 1000 08/10/18 1200 08/10/18 1202  BP: (!) 161/86 (!) 174/83 (!) 151/139 (!) 169/85  Pulse: 60 60 62 62  Resp:   18   Temp:      TempSrc:      SpO2: 94% 93% 95% 95%    Intake/Output Summary (Last 24 hours) at 08/10/2018 1235 Last data filed at 08/09/2018 2139 Gross per 24 hour  Intake 100 ml  Output --  Net 100 ml   There were no vitals filed for this visit.  Examination:  General exam: Slight discomfort due to abdominal pain.  NG tube in place to suction Respiratory system: Clear to auscultation. Respiratory effort normal. Cardiovascular system: S1 & S2 heard, RRR. No JVD, murmurs, rubs, gallops or clicks. No pedal edema. Gastrointestinal system: Abdomen is nondistended but slightly tender to deep palpation. Central nervous system: Alert and oriented. No focal neurological deficits. Extremities: Symmetric 5 x 5 power. Skin: No rashes, lesions or ulcers Psychiatry: Judgement and insight appear normal. Mood & affect appropriate.     Data Reviewed:   CBC: Recent Labs  Lab 08/09/18 1859 08/10/18 0630  WBC 10.4 9.4  NEUTROABS  --  6.4  HGB 13.3 12.9  HCT 40.6 38.0  MCV 90.0 90.7  PLT 346 242   Basic Metabolic Panel:  Recent Labs  Lab 08/09/18 1859 08/10/18 0630  NA 132* 133*  K 2.8* 3.3*  CL 95* 101  CO2 23 21*  GLUCOSE 92 141*  BUN 17 10  CREATININE 0.55 0.45  CALCIUM 9.5 8.5*   GFR: CrCl cannot be calculated (Unknown ideal weight.). Liver  Function Tests: Recent Labs  Lab 08/09/18 1859 08/10/18 0630  AST 22 21  ALT 17 16  ALKPHOS 75 68  BILITOT 0.8 0.6  PROT 7.1 6.1*  ALBUMIN 3.8 3.4*   Recent Labs  Lab 08/09/18 1859  LIPASE 26   No results for input(s): AMMONIA in the last 168 hours. Coagulation Profile: No results for input(s): INR, PROTIME in the last 168 hours. Cardiac Enzymes: No results for input(s): CKTOTAL, CKMB, CKMBINDEX, TROPONINI in the last 168 hours. BNP (last 3 results) No results for input(s): PROBNP in the last 8760 hours. HbA1C: No results for input(s): HGBA1C in the last 72 hours. CBG: Recent Labs  Lab 08/10/18 0743  GLUCAP 113*   Lipid Profile: No results for input(s): CHOL, HDL, LDLCALC, TRIG, CHOLHDL, LDLDIRECT in the last 72 hours. Thyroid Function Tests: No results for input(s): TSH, T4TOTAL, FREET4, T3FREE, THYROIDAB in the last 72 hours. Anemia Panel: No results for input(s): VITAMINB12, FOLATE, FERRITIN, TIBC, IRON, RETICCTPCT in the last 72 hours. Sepsis Labs: No results for input(s): PROCALCITON, LATICACIDVEN in the last 168 hours.  Recent Results (from the past 240 hour(s))  SARS Coronavirus 2 (CEPHEID - Performed in [place] hospital lab), Hosp Order     Status: None   Collection Time: 08/10/18  1:53 AM   Specimen: Nasopharyngeal Swab  Result Value Ref Range Status   SARS Coronavirus 2 NEGATIVE NEGATIVE Final    Comment: (NOTE) If result is NEGATIVE SARS-CoV-2 target nucleic acids are NOT DETECTED. The SARS-CoV-2 RNA is generally detectable in upper and lower  respiratory specimens during the acute phase of infection. The lowest  concentration of SARS-CoV-2 viral copies this assay can detect is 250  copies / mL. A negative result does not preclude SARS-CoV-2 infection  and should not be used as the sole basis for treatment or other  patient management decisions.  A negative result may occur with  improper specimen collection / handling, submission of specimen  other  than nasopharyngeal swab, presence of viral mutation(s) within the  areas targeted by this assay, and inadequate number of viral copies  (<250 copies / mL). A negative result must be combined with clinical  observations, patient history, and epidemiological information. If result is POSITIVE SARS-CoV-2 target nucleic acids are DETECTED. The SARS-CoV-2 RNA is generally detectable in upper and lower  respiratory specimens dur ing the acute phase of infection.  Positive  results are indicative of active infection with SARS-CoV-2.  Clinical  correlation with patient history and other diagnostic information is  necessary to determine patient infection status.  Positive results do  not rule out bacterial infection or co-infection with other viruses. If result is PRESUMPTIVE POSTIVE SARS-CoV-2 nucleic acids MAY BE PRESENT.   A presumptive positive result was obtained on the submitted specimen  and confirmed on repeat testing.  While 2019 novel coronavirus  (SARS-CoV-2) nucleic acids may be present in the submitted sample  additional confirmatory testing may be necessary for epidemiological  and / or clinical management purposes  to differentiate between  SARS-CoV-2 and other Sarbecovirus currently known to infect humans.  If clinically indicated additional testing with an alternate test  methodology [phone]) is advised. The SARS-CoV-2 RNA  is generally  detectable in upper and lower respiratory sp ecimens during the acute  phase of infection. The expected result is Negative. Fact Sheet for Patients:  StrictlyIdeas.no Fact Sheet for Healthcare Providers: BankingDealers.co.za This test is not yet approved or cleared by the [place] FDA and has been authorized for detection and/or diagnosis of SARS-CoV-2 by FDA under an Emergency Use Authorization (EUA).  This EUA will remain in effect (meaning this test can be used) for the duration  of the COVID-19 declaration under Section 564(b)(1) of the Act, 21 U.S.C. section 360bbb-3(b)(1), unless the authorization is terminated or revoked sooner. Performed at [facility], [place] [address], [place], [place] [address]          Radiology Studies: Ct Abdomen Pelvis W Contrast  Result Date: 08/09/2018 CLINICAL DATA:  Persistent abdominal pain since discharged 3 days ago for small bowel obstruction. EXAM: CT ABDOMEN AND PELVIS WITH CONTRAST TECHNIQUE: Multidetector CT imaging of the abdomen and pelvis was performed using the standard protocol following bolus administration of intravenous contrast. CONTRAST:  117mL OMNIPAQUE IOHEXOL 300 MG/ML  SOLN COMPARISON:  CT abdomen pelvis dated August 05, 2018. FINDINGS: Lower chest: No acute abnormality. New subsegmental atelectasis in the right lower lobe. Unchanged scarring at the left lung base. Hepatobiliary: No focal liver abnormality is seen. No gallstones, gallbladder wall thickening, or biliary dilatation. Pancreas: Mild atrophy. No ductal dilatation or surrounding inflammatory changes. Spleen: Normal in size without focal abnormality. Adrenals/Urinary Tract: Unchanged small bilateral adrenal adenomas. No renal calculi, focal lesion, or hydronephrosis. The bladder is unremarkable. Stomach/Bowel: Persistent diffuse small bowel dilatation to the level of the mid ileum, with transition point in the right lower quadrant (series 2, image 54; series 5, image 56). Mild edema in the small bowel mesentery is unchanged. No pneumatosis. The distal ileum and colon are decompressed. Mild left-sided colonic diverticulosis again noted. Normal appendix. Vascular/Lymphatic: Prior stent graft repair of an infrarenal abdominal aortic aneurysm. Aortoiliac atherosclerotic vascular disease. No enlarged abdominal or pelvic lymph nodes. Reproductive: Uterus and bilateral adnexa are unremarkable. Other: Unchanged trace free fluid in the pelvis. No  pneumoperitoneum. Musculoskeletal: No acute or significant osseous findings. IMPRESSION: 1. Persistent distal small bowel obstruction with transition point in the right lower quadrant. Electronically Signed   By: Titus Dubin M.D.   On: 08/09/2018 20:33   Dg Abd Portable 1v-small Bowel Protocol-position Verification  Result Date: 08/10/2018 CLINICAL DATA:  Nasogastric tube placement. EXAM: PORTABLE ABDOMEN - 1 VIEW COMPARISON:  CT scan of August 09, 2018. Radiographs of August 06, 2018. FINDINGS: Distal tip of nasogastric tube is seen in expected position of proximal stomach. Increased small bowel dilatation is noted concerning for distal small bowel obstruction. Residual contrast is noted in nondilated colon. Status post stent graft repair of abdominal aortic aneurysm. IMPRESSION: Nasogastric tube tip seen in expected position of proximal stomach. Increased small bowel dilatation is noted concerning for distal small bowel obstruction. Electronically Signed   By: Marijo Conception M.D.   On: 08/10/2018 09:23        Scheduled Meds:  diatrizoate meglumine-sodium  90 mL Per NG tube Once   nicotine  7 mg Transdermal Once   Continuous Infusions:  dextrose 5 % and 0.9 % NaCl with KCl 20 mEq/L 100 mL/hr at 08/10/18 0151     LOS: 1 day   Time spent= 35 mins    Simra Fiebig Arsenio Loader, MD [facility]  If 7PM-7AM, please contact night-coverage [email or web address] 08/10/2018, 12:35 PM

## 2018-08-10 NOTE — ED Notes (Signed)
This nurse notified xray that NG tube is placed and ready for xray to verify placement.

## 2018-08-10 NOTE — ED Notes (Addendum)
hospitalist at  Bedside.

## 2018-08-10 NOTE — Consult Note (Addendum)
[facility] Surgery Consult Note  Gloria Morrison 1950-07-06  [id number].    Requesting MD: Providence Lanius, PA-C Chief Complaint/Reason for Consult: SBO  HPI: Gloria Morrison is a 68 y.o. female with a history of GERD, HTN, tobacco abuse, and aortic aneurysm status post repair 4 years ago who presented to the to [facility] in the late hours of 6/22 with abdominal pain, abdominal distension and constipation who was found to have a SBO.   The patient was recently admitted to [facility] on 6/18 for a small bowel obstruction.  She reports that she was treated conservatively with a NG tube and bowel rest.  It appears per their note, patient improved with conservative therapy. Prior to discharge their note states she was "passing little gas and had very small BM, started on full liquid diet, tolerated well, no vomiting". She reports that her original pain began on 6/18 with epigastric abdominal "burning" with associated abdominal distension, nausea and emesis. Her nausea and emesis have resolved since 6/19. It appears close follow up was arranged opn d/c. She notes after returning home she had some soup, milk and muscle milk. Shortly after began having some lower abdominal pain that she describes as stabbing along with abdominal distention.  She reports she is no longer able to pass flatus and has not had a BM since leaving the hospital.  She had some oatmeal and muscle milk yesterday morning and her symptoms worsen to the point where she felt she needed to come to the emergency department later that night.  She denies any fever, chills, chest pain, shortness of breath, cough.  No prior abdominal surgeries.  She came to [facility] instead of going back to [facility] because she wanted "something done about my bowel obstruction". She is not on any blood thinners. No hx of SBO prior to her admission on 6/18.  ROS: Review of Systems  Constitutional: Negative for chills and fever.  Respiratory: Negative for cough and  shortness of breath.   Cardiovascular: Negative for chest pain and leg swelling.  Gastrointestinal: Positive for abdominal pain, nausea and vomiting. Negative for blood in stool, constipation, diarrhea and melena.  Genitourinary: Negative for dysuria.  All other systems reviewed and are negative.  All systems reviewed and otherwise negative except for as above  Family History  Problem Relation Age of Onset  . Melanoma Father   . Hypertension Sister   . Crohn's disease Brother     Past Medical History:  Diagnosis Date  . AAA (abdominal aortic aneurysm) without rupture ([place]) 07/06/2014  . Complication of anesthesia    hard to wake up (pt does not want Versed)  . Cough    allergies  . Essential hypertension    takes Lisinopril daily  . GERD (gastroesophageal reflux disease)    once in a while-will take OTC meds if needed  . Headache    occasionally  . History of bronchitis    a yr ago  . Hyperlipidemia    borderline;was on meds but has been off x 12 yrs   . Insomnia    takes Lorazepam as needed  . Microscopic hematuria   . URI (upper respiratory infection)    Jan 2016    Past Surgical History:  Procedure Laterality Date  . ABDOMINAL AORTIC ENDOVASCULAR STENT GRAFT N/A 07/19/2014   Procedure: ABDOMINAL AORTIC ENDOVASCULAR STENT GRAFT;  Surgeon: Elam Dutch, MD;  Location: [place];  Service: Vascular;  Laterality: N/A;  . BREAST FIBROADENOMA SURGERY Left  1986   lumpectomy  . TONSILLECTOMY  as a child    Social History:  reports that she has been smoking cigarettes. She started smoking about 27 years ago. She has been smoking about 0.25 packs per day. She has never used smokeless tobacco. She reports current alcohol use. She reports that she does not use drugs.  Patient lives at home with her husband. Normal walks a mile per day. Works as an Optometrist. Smokes 1/2 PPD. Drinks socially. No illicit drug use.   Allergies:  Allergies  Allergen Reactions  . Tramadol  Diarrhea    Other reaction(s): GI Upset (intolerance)    (Not in a hospital admission)   Prior to Admission medications   Medication Sig Start Date End Date Taking? Authorizing Provider  amLODipine (NORVASC) 5 MG tablet Take 1 tablet by mouth daily. 02/12/16  Yes [provider]  aspirin EC 81 MG tablet Take 81 mg by mouth daily.    Yes [provider]  calcium carbonate 1250 MG capsule Take 1,250 mg by mouth.   Yes [provider]  Coenzyme Q10 (CO Q 10) 10 MG CAPS Take 100 mg by mouth.   Yes [provider]  estradiol (ESTRACE) 0.1 MG/GM vaginal cream Place 1 Applicatorful vaginally once a week.  02/16/14  Yes [provider]  LORazepam (ATIVAN) 1 MG tablet Take 1 mg by mouth as needed for anxiety.  07/17/16  Yes [provider]  Multiple Vitamins-Minerals (ONE DAILY PLUS MINERALS) TABS Take 1 tablet by mouth.    Yes [provider]  pantoprazole (PROTONIX) 40 MG tablet Take 40 mg by mouth daily. 05/16/16  Yes [provider]    Blood pressure (!) 161/97, pulse 64, temperature 98.2 F (36.8 C), temperature source Oral, resp. rate 18, SpO2 94 %. Physical Exam: General: irritated, WD/WN female who is laying in bed in NAD HEENT: head is normocephalic, atraumatic.  Sclera are noninjected.  Pupils equal and round.  Ears and nose without any masses or lesions.  Mouth is pink and moist. Dentition fair Heart: regular, rate, and rhythm.  No obvious murmurs, gallops, or rubs noted.  Palpable pedal pulses bilaterally Lungs: CTAB, no wheezes, rhonchi, or rales noted.  Respiratory effort nonlabored Abd: Soft, mild distension, generalized tenderness without focal tenderness, no r/r/g or signs of peritonitis. Normoactive, high pitched BS. No masses, hernias, or organomegaly MS: all 4 extremities are symmetrical with no cyanosis, clubbing, or edema. Skin: warm and dry with no masses, lesions, or rashes Psych: A&Ox3 with an  appropriate affect. Neuro: cranial nerves grossly intact, extremity CSM intact bilaterally, normal speech  Results for orders placed or performed during the hospital encounter of 08/09/18 (from the past 48 hour(s))  Urinalysis, Routine w reflex microscopic     Status: Abnormal   Collection Time: 08/09/18  6:14 PM  Result Value Ref Range   Color, Urine YELLOW YELLOW   APPearance CLEAR CLEAR   Specific Gravity, Urine 1.015 1.005 - 1.030   pH 6.0 5.0 - 8.0   Glucose, UA NEGATIVE NEGATIVE mg/dL   Hgb urine dipstick SMALL (A) NEGATIVE   Bilirubin Urine NEGATIVE NEGATIVE   Ketones, ur 5 (A) NEGATIVE mg/dL   Protein, ur NEGATIVE NEGATIVE mg/dL   Nitrite NEGATIVE NEGATIVE   Leukocytes,Ua TRACE (A) NEGATIVE   RBC / HPF 11-20 0 - 5 RBC/hpf   WBC, UA 6-10 0 - 5 WBC/hpf   Bacteria, UA FEW (A) NONE SEEN   Squamous Epithelial / LPF 0-5 0 -  5   Mucus PRESENT     Comment: Performed at [facility], [place] [address], [place], [place] [address]  Lipase, blood     Status: None   Collection Time: 08/09/18  6:59 PM  Result Value Ref Range   Lipase 26 11 - 51 U/L    Comment: Performed at [facility], [place] [address] [place], [place] [address]  Comprehensive metabolic panel     Status: Abnormal   Collection Time: 08/09/18  6:59 PM  Result Value Ref Range   Sodium 132 (L) 135 - 145 mmol/L   Potassium 2.8 (L) 3.5 - 5.1 mmol/L   Chloride 95 (L) 98 - 111 mmol/L   CO2 23 22 - 32 mmol/L   Glucose, Bld 92 70 - 99 mg/dL   BUN 17 8 - 23 mg/dL   Creatinine, Ser 0.55 0.44 - 1.00 mg/dL   Calcium 9.5 8.9 - 10.3 mg/dL   Total Protein 7.1 6.5 - 8.1 g/dL   Albumin 3.8 3.5 - 5.0 g/dL   AST 22 15 - 41 U/L   ALT 17 0 - 44 U/L   Alkaline Phosphatase 75 38 - 126 U/L   Total Bilirubin 0.8 0.3 - 1.2 mg/dL   GFR calc non Af Amer >60 >60 mL/min   GFR calc Af Amer >60 >60 mL/min   Anion gap 14 5 - 15    Comment: Performed at [facility], [place] [address],  [place], [place] [address]  CBC     Status: None   Collection Time: 08/09/18  6:59 PM  Result Value Ref Range   WBC 10.4 4.0 - 10.5 K/uL   RBC 4.51 3.87 - 5.11 MIL/uL   Hemoglobin 13.3 12.0 - 15.0 g/dL   HCT 40.6 36.0 - 46.0 %   MCV 90.0 80.0 - 100.0 fL   MCH 29.5 26.0 - 34.0 pg   MCHC 32.8 30.0 - 36.0 g/dL   RDW 13.7 11.5 - 15.5 %   Platelets 346 150 - 400 K/uL   nRBC 0.0 0.0 - 0.2 %    Comment: Performed at [facility], [place] [address], [place],  [address]  SARS Coronavirus 2 (CEPHEID - Performed in [place] hospital lab), Hosp Order     Status: None   Collection Time: 08/10/18  1:53 AM   Specimen: Nasopharyngeal Swab  Result Value Ref Range   SARS Coronavirus 2 NEGATIVE NEGATIVE    Comment: (NOTE) If result is NEGATIVE SARS-CoV-2 target nucleic acids are NOT DETECTED. The SARS-CoV-2 RNA is generally detectable in upper and lower  respiratory specimens during the acute phase of infection. The lowest  concentration of SARS-CoV-2 viral copies this assay can detect is 250  copies / mL. A negative result does not preclude SARS-CoV-2 infection  and should not be used as the sole basis for treatment or other  patient management decisions.  A negative result may occur with  improper specimen collection / handling, submission of specimen other  than nasopharyngeal swab, presence of viral mutation(s) within the  areas targeted by this assay, and inadequate number of viral copies  (<250 copies / mL). A negative result must be combined with clinical  observations, patient history, and epidemiological information. If result is POSITIVE SARS-CoV-2 target nucleic acids are DETECTED. The SARS-CoV-2 RNA is generally detectable in upper and lower  respiratory specimens dur ing the acute phase of infection.  Positive  results are indicative of active infection with SARS-CoV-2.  Clinical  correlation with patient history and other diagnostic information is  necessary to  determine patient infection status.  Positive results do  not rule out bacterial infection or co-infection with other viruses. If result is PRESUMPTIVE POSTIVE SARS-CoV-2 nucleic acids MAY BE PRESENT.   A presumptive positive result was obtained on the submitted specimen  and confirmed on repeat testing.  While 2019 novel coronavirus  (SARS-CoV-2) nucleic acids may be present in the submitted sample  additional confirmatory testing may be necessary for epidemiological  and / or clinical management purposes  to differentiate between  SARS-CoV-2 and other Sarbecovirus currently known to infect humans.  If clinically indicated additional testing with an alternate test  methodology [phone]) is advised. The SARS-CoV-2 RNA is generally  detectable in upper and lower respiratory sp ecimens during the acute  phase of infection. The expected result is Negative. Fact Sheet for Patients:  StrictlyIdeas.no Fact Sheet for Healthcare Providers: BankingDealers.co.za This test is not yet approved or cleared by the [place] FDA and has been authorized for detection and/or diagnosis of SARS-CoV-2 by FDA under an Emergency Use Authorization (EUA).  This EUA will remain in effect (meaning this test can be used) for the duration of the COVID-19 declaration under Section 564(b)(1) of the Act, 21 U.S.C. section 360bbb-3(b)(1), unless the authorization is terminated or revoked sooner. Performed at [facility], [place] [address], [place], [place] [address]   Comprehensive metabolic panel     Status: Abnormal   Collection Time: 08/10/18  6:30 AM  Result Value Ref Range   Sodium 133 (L) 135 - 145 mmol/L   Potassium 3.3 (L) 3.5 - 5.1 mmol/L   Chloride 101 98 - 111 mmol/L   CO2 21 (L) 22 - 32 mmol/L   Glucose, Bld 141 (H) 70 - 99 mg/dL   BUN 10 8 - 23 mg/dL   Creatinine, Ser 0.45 0.44 - 1.00 mg/dL   Calcium 8.5 (L) 8.9 - 10.3 mg/dL    Total Protein 6.1 (L) 6.5 - 8.1 g/dL   Albumin 3.4 (L) 3.5 - 5.0 g/dL   AST 21 15 - 41 U/L   ALT 16 0 - 44 U/L   Alkaline Phosphatase 68 38 - 126 U/L   Total Bilirubin 0.6 0.3 - 1.2 mg/dL   GFR calc non Af Amer >60 >60 mL/min   GFR calc Af Amer >60 >60 mL/min   Anion gap 11 5 - 15    Comment: Performed at [facility], [place] [address] [place], [place] [address]  CBC WITH DIFFERENTIAL     Status: None   Collection Time: 08/10/18  6:30 AM  Result Value Ref Range   WBC 9.4 4.0 - 10.5 K/uL   RBC 4.19 3.87 - 5.11 MIL/uL   Hemoglobin 12.9 12.0 - 15.0 g/dL   HCT 38.0 36.0 - 46.0 %   MCV 90.7 80.0 - 100.0 fL   MCH 30.8 26.0 - 34.0 pg   MCHC 33.9 30.0 - 36.0 g/dL   RDW 13.6 11.5 - 15.5 %   Platelets 299 150 - 400 K/uL   nRBC 0.0 0.0 - 0.2 %   Neutrophils Relative % 67 %   Neutro Abs 6.4 1.7 - 7.7 K/uL   Lymphocytes Relative 21 %   Lymphs Abs 1.9 0.7 - 4.0 K/uL   Monocytes Relative 9 %   Monocytes Absolute 0.8 0.1 - 1.0 K/uL   Eosinophils Relative 1 %   Eosinophils Absolute 0.1 0.0 - 0.5  K/uL   Basophils Relative 1 %   Basophils Absolute 0.1 0.0 - 0.1 K/uL   Immature Granulocytes 1 %   Abs Immature Granulocytes 0.05 0.00 - 0.07 K/uL    Comment: Performed at [facility], [place] [address], [place], [place] [address]  CBG monitoring, ED     Status: Abnormal   Collection Time: 08/10/18  7:43 AM  Result Value Ref Range   Glucose-Capillary 113 (H) 70 - 99 mg/dL   Ct Abdomen Pelvis W Contrast  Result Date: 08/09/2018 CLINICAL DATA:  Persistent abdominal pain since discharged 3 days ago for small bowel obstruction. EXAM: CT ABDOMEN AND PELVIS WITH CONTRAST TECHNIQUE: Multidetector CT imaging of the abdomen and pelvis was performed using the standard protocol following bolus administration of intravenous contrast. CONTRAST:  183mL OMNIPAQUE IOHEXOL 300 MG/ML  SOLN COMPARISON:  CT abdomen pelvis dated August 05, 2018. FINDINGS: Lower chest: No acute  abnormality. New subsegmental atelectasis in the right lower lobe. Unchanged scarring at the left lung base. Hepatobiliary: No focal liver abnormality is seen. No gallstones, gallbladder wall thickening, or biliary dilatation. Pancreas: Mild atrophy. No ductal dilatation or surrounding inflammatory changes. Spleen: Normal in size without focal abnormality. Adrenals/Urinary Tract: Unchanged small bilateral adrenal adenomas. No renal calculi, focal lesion, or hydronephrosis. The bladder is unremarkable. Stomach/Bowel: Persistent diffuse small bowel dilatation to the level of the mid ileum, with transition point in the right lower quadrant (series 2, image 54; series 5, image 56). Mild edema in the small bowel mesentery is unchanged. No pneumatosis. The distal ileum and colon are decompressed. Mild left-sided colonic diverticulosis again noted. Normal appendix. Vascular/Lymphatic: Prior stent graft repair of an infrarenal abdominal aortic aneurysm. Aortoiliac atherosclerotic vascular disease. No enlarged abdominal or pelvic lymph nodes. Reproductive: Uterus and bilateral adnexa are unremarkable. Other: Unchanged trace free fluid in the pelvis. No pneumoperitoneum. Musculoskeletal: No acute or significant osseous findings. IMPRESSION: 1. Persistent distal small bowel obstruction with transition point in the right lower quadrant. Electronically Signed   By: Titus Dubin M.D.   On: 08/09/2018 20:33   Anti-infectives (From admission, onward)   None     Assessment/Plan GERD HTN Tobacco abuse Hx of aortic aneurysm status post repair 4 years ago   SBO - Recent admission to [facility] on 6/18 for SBO that resolved with conservative management - CT with persistent distal small bowel obstruction with transition point in the right lower quadrant - Contrast does appear to have passed in her colon on CT, likely from prior admission, pointing to this resolving/partially resolving at some point.  - No indication for  emergent or urgent surgery at this time.  - Will plan for NGT, SBO protocol, replenish electrolytes, nausea/pain control, IVF - Keep K > 4.0 and Mg > 2.0 for bowel function - Mobilize for bowel function - We will continue to follow   ID - None VTE - SCDs,  FEN - NPO  Jillyn Ledger, [facility] Surgery 08/10/2018, 8:03 AM Pager: [phone]

## 2018-08-10 NOTE — ED Notes (Signed)
Pt transferred to hospital bed for comfort due to holding in the ED

## 2018-08-11 ENCOUNTER — Other Ambulatory Visit: Payer: Self-pay

## 2018-08-11 ENCOUNTER — Inpatient Hospital Stay (HOSPITAL_COMMUNITY): Payer: Medicare HMO

## 2018-08-11 LAB — BASIC METABOLIC PANEL
Anion gap: 12 (ref 5–15)
BUN: 12 mg/dL (ref 8–23)
CO2: 22 mmol/L (ref 22–32)
Calcium: 8.6 mg/dL — ABNORMAL LOW (ref 8.9–10.3)
Chloride: 107 mmol/L (ref 98–111)
Creatinine, Ser: 0.52 mg/dL (ref 0.44–1.00)
GFR calc Af Amer: >60 mL/min (ref >60)
GFR calc non Af Amer: >60 mL/min (ref >60)
Glucose, Bld: 113 mg/dL — ABNORMAL HIGH (ref 70–99)
Potassium: 3.1 mmol/L — ABNORMAL LOW (ref 3.5–5.1)
Sodium: 141 mmol/L (ref 135–145)

## 2018-08-11 LAB — CBC
HCT: 40.6 % (ref 36.0–46.0)
Hemoglobin: 13.3 g/dL (ref 12.0–15.0)
MCH: 30.4 pg (ref 26.0–34.0)
MCHC: 32.8 g/dL (ref 30.0–36.0)
MCV: 92.9 fL (ref 80.0–100.0)
Platelets: 332 10*3/uL (ref 150–400)
RBC: 4.37 MIL/uL (ref 3.87–5.11)
RDW: 14 % (ref 11.5–15.5)
WBC: 9.7 10*3/uL (ref 4.0–10.5)
nRBC: 0 % (ref 0.0–0.2)

## 2018-08-11 LAB — GLUCOSE, CAPILLARY
Glucose-Capillary: 112 mg/dL — ABNORMAL HIGH (ref 70–99)
Glucose-Capillary: 113 mg/dL — ABNORMAL HIGH (ref 70–99)
Glucose-Capillary: 117 mg/dL — ABNORMAL HIGH (ref 70–99)
Glucose-Capillary: 86 mg/dL (ref 70–99)

## 2018-08-11 LAB — HIV ANTIBODY (ROUTINE TESTING W REFLEX): HIV Screen 4th Generation wRfx: NONREACTIVE

## 2018-08-11 LAB — MAGNESIUM: Magnesium: 1.9 mg/dL (ref 1.7–2.4)

## 2018-08-11 MED ORDER — LORAZEPAM 2 MG/ML IJ SOLN
0.5000 mg | Freq: Once | INTRAMUSCULAR | Status: AC
  Administered 2018-08-11: 0.5 mg via INTRAVENOUS
  Filled 2018-08-11: qty 1

## 2018-08-11 MED ORDER — LORAZEPAM 1 MG PO TABS: 1.0000 mg | ORAL_TABLET | Freq: Every evening | ORAL | Status: DC | PRN

## 2018-08-11 MED ORDER — KCL IN DEXTROSE-NACL 20-5-0.9 MEQ/L-%-% IV SOLN
INTRAVENOUS | Status: DC
  Administered 2018-08-11 – 2018-08-12 (×3): via INTRAVENOUS
  Filled 2018-08-11 (×3): qty 1000

## 2018-08-11 MED ORDER — SENNOSIDES-DOCUSATE SODIUM 8.6-50 MG PO TABS: 2.0000 | ORAL_TABLET | Freq: Every evening | ORAL | Status: DC | PRN

## 2018-08-11 MED ORDER — POTASSIUM CHLORIDE 10 MEQ/100ML IV SOLN
10.0000 meq | INTRAVENOUS | Status: AC
  Administered 2018-08-11 – 2018-08-12 (×4): 10 meq via INTRAVENOUS
  Filled 2018-08-11 (×3): qty 100

## 2018-08-11 MED ORDER — MENTHOL 3 MG MT LOZG
1.0000 | LOZENGE | OROMUCOSAL | Status: DC | PRN
  Filled 2018-08-11: qty 9

## 2018-08-11 MED ORDER — SODIUM CHLORIDE 0.9 % IV SOLN
INTRAVENOUS | Status: DC | PRN
  Administered 2018-08-11: 20:00:00 via INTRAVENOUS
  Administered 2018-08-11: 250 mL via INTRAVENOUS
  Administered 2018-08-12: 06:00:00 via INTRAVENOUS
  Administered 2018-08-13: 1000 mL via INTRAVENOUS
  Administered 2018-08-14: 10 mL via INTRAVENOUS
  Administered 2018-08-15: 1000 mL via INTRAVENOUS

## 2018-08-11 MED ORDER — PHENOL 1.4 % MT LIQD: 1.0000 | OROMUCOSAL | Status: DC | PRN

## 2018-08-11 MED ORDER — POTASSIUM CHLORIDE 10 MEQ/100ML IV SOLN
10.0000 meq | INTRAVENOUS | Status: AC
  Administered 2018-08-11 (×2): 10 meq via INTRAVENOUS
  Filled 2018-08-11: qty 100

## 2018-08-11 NOTE — Progress Notes (Addendum)
Subjective: CC:  SBO Patient reports feeling much better. Less distended. 1700 out from NG in last 24 hours. Pain and nausea have resolved. She notes she has passed flatus this morning. Mobilizing.   Objective: Vital signs in last 24 hours: Temp:  [97.8 F (36.6 C)-98.3 F (36.8 C)] 98.1 F (36.7 C) (06/24 0532) Pulse Rate:  [62-72] 72 (06/24 0532) Resp:  [12-18] 12 (06/24 0532) BP: (148-169)/(85-139) 148/95 (06/24 0532) SpO2:  [93 %-96 %] 93 % (06/24 0532) Weight:  [68.1 kg] 68.1 kg (06/24 0532) Last BM Date: 08/07/18  Intake/Output from previous day: 06/23 0701 - 06/24 0700 In: 2243.7 [I.V.:2243.7] Out: 2300 [Urine:600; Emesis/NG output:1700] Intake/Output this shift: No intake/output data recorded.  PE: Gen: Awake and alert,NAD Lungs: Normal effort Abd: Soft, mild distension, some tenderness of the epigastrium and LUQ without r/r/g. +BS. NG tube in place. 1700cc/24 hours. Dark/Bilious  Msk: no edema   Lab Results:  Recent Labs    08/10/18 0630 08/11/18 0508  WBC 9.4 9.7  HGB 12.9 13.3  HCT 38.0 40.6  PLT 299 332   BMET Recent Labs    08/10/18 0630 08/11/18 0508  NA 133* 141  K 3.3* 3.1*  CL 101 107  CO2 21* 22  GLUCOSE 141* 113*  BUN 10 12  CREATININE 0.45 0.52  CALCIUM 8.5* 8.6*   PT/INR No results for input(s): LABPROT, INR in the last 72 hours. CMP     Component Value Date/Time   NA 141 08/11/2018 0508   K 3.1 (L) 08/11/2018 0508   CL 107 08/11/2018 0508   CO2 22 08/11/2018 0508   GLUCOSE 113 (H) 08/11/2018 0508   BUN 12 08/11/2018 0508   CREATININE 0.52 08/11/2018 0508   CREATININE 0.49 (L) 01/19/2015 1239   CALCIUM 8.6 (L) 08/11/2018 0508   PROT 6.1 (L) 08/10/2018 0630   ALBUMIN 3.4 (L) 08/10/2018 0630   AST 21 08/10/2018 0630   ALT 16 08/10/2018 0630   ALKPHOS 68 08/10/2018 0630   BILITOT 0.6 08/10/2018 0630   GFRNONAA >60 08/11/2018 0508   GFRAA >60 08/11/2018 0508   Lipase     Component Value Date/Time   LIPASE 26  08/09/2018 1859       Studies/Results: Ct Abdomen Pelvis W Contrast  Result Date: 08/09/2018 CLINICAL DATA:  Persistent abdominal pain since discharged 3 days ago for small bowel obstruction. EXAM: CT ABDOMEN AND PELVIS WITH CONTRAST TECHNIQUE: Multidetector CT imaging of the abdomen and pelvis was performed using the standard protocol following bolus administration of intravenous contrast. CONTRAST:  131mL OMNIPAQUE IOHEXOL 300 MG/ML  SOLN COMPARISON:  CT abdomen pelvis dated August 05, 2018. FINDINGS: Lower chest: No acute abnormality. New subsegmental atelectasis in the right lower lobe. Unchanged scarring at the left lung base. Hepatobiliary: No focal liver abnormality is seen. No gallstones, gallbladder wall thickening, or biliary dilatation. Pancreas: Mild atrophy. No ductal dilatation or surrounding inflammatory changes. Spleen: Normal in size without focal abnormality. Adrenals/Urinary Tract: Unchanged small bilateral adrenal adenomas. No renal calculi, focal lesion, or hydronephrosis. The bladder is unremarkable. Stomach/Bowel: Persistent diffuse small bowel dilatation to the level of the mid ileum, with transition point in the right lower quadrant (series 2, image 54; series 5, image 56). Mild edema in the small bowel mesentery is unchanged. No pneumatosis. The distal ileum and colon are decompressed. Mild left-sided colonic diverticulosis again noted. Normal appendix. Vascular/Lymphatic: Prior stent graft repair of an infrarenal abdominal aortic aneurysm. Aortoiliac atherosclerotic vascular disease. No  enlarged abdominal or pelvic lymph nodes. Reproductive: Uterus and bilateral adnexa are unremarkable. Other: Unchanged trace free fluid in the pelvis. No pneumoperitoneum. Musculoskeletal: No acute or significant osseous findings. IMPRESSION: 1. Persistent distal small bowel obstruction with transition point in the right lower quadrant. Electronically Signed   By: Titus Dubin M.D.   On:  08/09/2018 20:33   Dg Abd Portable 1v-small Bowel Obstruction Protocol-initial, 8 Hr Delay  Result Date: 08/10/2018 CLINICAL DATA:  Small-bowel obstruction. EXAM: PORTABLE ABDOMEN - 1 VIEW COMPARISON:  August 10, 2018 FINDINGS: There are persistent dilated loops of small bowel scattered throughout the abdomen. There is persistent oral contrast in the right hemicolon and transverse colon. This is not significantly changed from prior study. The nasogastric tube projects over the upper abdomen. Oral contrast is likely still within the small bowel. The patient is status post prior EVAR. IMPRESSION: 1. Oral contrast likely remains within the small bowel. 2. Persistent dilated loops of small bowel scattered throughout the abdomen, not significantly improved from prior study. 3. Oral contrast is noted in the right hemicolon which is unchanged from radiograph dated 08/10/2018 at approximately 8:45 a.m. 4. Nasogastric tube projects over the left upper quadrant. Electronically Signed   By: Constance Holster M.D.   On: 08/10/2018 23:35   Dg Abd Portable 1v-small Bowel Protocol-position Verification  Result Date: 08/10/2018 CLINICAL DATA:  Nasogastric tube placement. EXAM: PORTABLE ABDOMEN - 1 VIEW COMPARISON:  CT scan of August 09, 2018. Radiographs of August 06, 2018. FINDINGS: Distal tip of nasogastric tube is seen in expected position of proximal stomach. Increased small bowel dilatation is noted concerning for distal small bowel obstruction. Residual contrast is noted in nondilated colon. Status post stent graft repair of abdominal aortic aneurysm. IMPRESSION: Nasogastric tube tip seen in expected position of proximal stomach. Increased small bowel dilatation is noted concerning for distal small bowel obstruction. Electronically Signed   By: Marijo Conception M.D.   On: 08/10/2018 09:23    Anti-infectives: Anti-infectives (From admission, onward)   None       Assessment/Plan GERD HTN Tobacco abuse Hx of  aortic aneurysm status post repair 4 years ago (Endovascular)   SBO - Recent admission to [facility] on 6/18 for SBO that resolved with conservative management - CT with persistent distal small bowel obstruction with transition point in the right lower quadrant - Contrast does appear to have passed in her colon on CT, likely from prior admission, pointing to this resolving/partially resolving at some point.  - 1700 NG tube/24 hours - Keep K > 4.0 and Mg > 2.0 for bowel function - AM xray reviewed with continued dilated small bowel. Contrast in right colon as seen previously. Awaiting final read.  - Clinically improving but radiographically the same. 1700cc/24 hours from NGT. Continue NGT, replenish electrolytes, nausea/pain control, IVF. AM xray. If does not improve, likely with require ex-lap.  - Mobilize for bowel function - We will continue to follow   ID - None VTE - SCDs, okay for chemical prophylaxis from a surgical standpoint FEN - NPO, Mg 1.9, K 3.1 (being replaced)   LOS: 2 days    Jillyn Ledger , [facility] Surgery 08/11/2018, 10:39 AM Pager: [phone]

## 2018-08-11 NOTE — Progress Notes (Signed)
PROGRESS NOTE    Gloria Morrison  OVF:[id number] DOB: 10-Feb-1951 DOA: 08/09/2018 PCP: Berkley Harvey, NP   Brief Narrative:  68 year old with history of GERD, tobacco use, essential hypertension, abdominal aortic aneurysm repair about 4 years ago came to the hospital with abdominal pain.  Patient was admitted to the hospital for recurrent distal small bowel obstruction.  She was just at [facility] with the same issue treated conservatively.  Surgery team has been consulted.   Assessment & Plan:   Principal Problem:   SBO (small bowel obstruction) (HCC) Active Problems:   AAA (abdominal aortic aneurysm) without rupture (HCC)   HTN (hypertension)   Hypokalemia  Distal small bowel obstruction with transition point in right lower quadrant Abdominal pain with nausea - Patient states abdominal pain is much better and able to pass gas several times this morning.  Will clamp her tube, start her on clear liquid diet.  Advised her to mobilize as much as possible. Possibly she was still partially obstructed from her previous episode. -Aggressively replete electrolytes  Essential hypertension - Amlodipine is currently on hold.  GERD -PPI been able to take oral.  History of abdominal aortic aneurysm without rupture -Status post endovascular repair.   VT prophylaxis: SCDs Code Status: Full code Family Communication: None at bedside Disposition Plan: Maintain hospital stay  Consultants:   General surgery  Procedures:  None Antimicrobials:   None   Subjective: States she feels better this morning.  She is able to pass gas at least 5-7 times this morning.  She has been ambulating in the hallway.  Review of Systems Otherwise negative except as per HPI, including: General = no fevers, chills, dizziness, malaise, fatigue HEENT/EYES = negative for pain, redness, loss of vision, double vision, blurred vision, loss of hearing, sore throat, hoarseness,  dysphagia Cardiovascular= negative for chest pain, palpitation, murmurs, lower extremity swelling Respiratory/lungs= negative for shortness of breath, cough, hemoptysis, wheezing, mucus production Gastrointestinal= negative for nausea, vomiting,, abdominal pain, melena, hematemesis Genitourinary= negative for Dysuria, Hematuria, Change in Urinary Frequency MSK = Negative for arthralgia, myalgias, Back Pain, Joint swelling  Neurology= Negative for headache, seizures, numbness, tingling  Psychiatry= Negative for anxiety, depression, suicidal and homocidal ideation Allergy/Immunology= Medication/Food allergy as listed  Skin= Negative for Rash, lesions, ulcers, itching   Objective: Vitals:   08/10/18 1202 08/10/18 1302 08/10/18 2200 08/11/18 0532  BP: (!) 169/85 (!) 164/86 (!) 151/88 (!) 148/95  Pulse: 62 63 67 72  Resp:  17 14 12   Temp:  97.8 F (36.6 C) 98.3 F (36.8 C) 98.1 F (36.7 C)  TempSrc:   Oral Oral  SpO2: 95% 96% 94% 93%  Weight:    68.1 kg  Height:    5\' 6"  (1.676 m)    Intake/Output Summary (Last 24 hours) at 08/11/2018 1207 Last data filed at 08/11/2018 0542 Gross per 24 hour  Intake 2243.66 ml  Output 2300 ml  Net -56.34 ml   Filed Weights   08/11/18 0532  Weight: 68.1 kg    Examination:  Constitutional: NAD, calm, comfortable, NG tube currently in place to suction. Eyes: PERRL, lids and conjunctivae normal ENMT: Mucous membranes are moist. Neck: normal, supple, no masses, no thyromegaly Respiratory: clear to auscultation bilaterally, no wheezing, no crackles. Normal respiratory effort. No accessory muscle use.  Cardiovascular: Regular rate and rhythm, no murmurs / rubs / gallops. No extremity edema. 2+ pedal pulses. No carotid bruits.  Abdomen: Minimal tenderness to deep palpation, no masses palpated. No  hepatosplenomegaly. Bowel sounds positive.  Musculoskeletal: no clubbing / cyanosis. No joint deformity upper and lower extremities. Good ROM, no  contractures. Normal muscle tone.  Skin: no rashes, lesions, ulcers. No induration Neurologic: CN 2-12 grossly intact. Sensation intact, DTR normal. Strength 5/5 in all 4.  Psychiatric: Normal judgment and insight. Alert and oriented x 3. Normal mood.     Data Reviewed:   CBC: Recent Labs  Lab 08/09/18 1859 08/10/18 0630 08/11/18 0508  WBC 10.4 9.4 9.7  NEUTROABS  --  6.4  --   HGB 13.3 12.9 13.3  HCT 40.6 38.0 40.6  MCV 90.0 90.7 92.9  PLT 346 299 970   Basic Metabolic Panel: Recent Labs  Lab 08/09/18 1859 08/10/18 0630 08/11/18 0508  NA 132* 133* 141  K 2.8* 3.3* 3.1*  CL 95* 101 107  CO2 23 21* 22  GLUCOSE 92 141* 113*  BUN 17 10 12   CREATININE 0.55 0.45 0.52  CALCIUM 9.5 8.5* 8.6*  MG  --   --  1.9   GFR: Estimated Creatinine Clearance: 64.8 mL/min (by C-G formula based on SCr of 0.52 mg/dL). Liver Function Tests: Recent Labs  Lab 08/09/18 1859 08/10/18 0630  AST 22 21  ALT 17 16  ALKPHOS 75 68  BILITOT 0.8 0.6  PROT 7.1 6.1*  ALBUMIN 3.8 3.4*   Recent Labs  Lab 08/09/18 1859  LIPASE 26   No results for input(s): AMMONIA in the last 168 hours. Coagulation Profile: No results for input(s): INR, PROTIME in the last 168 hours. Cardiac Enzymes: No results for input(s): CKTOTAL, CKMB, CKMBINDEX, TROPONINI in the last 168 hours. BNP (last 3 results) No results for input(s): PROBNP in the last 8760 hours. HbA1C: No results for input(s): HGBA1C in the last 72 hours. CBG: Recent Labs  Lab 08/10/18 0743 08/10/18 1729 08/11/18 0111 08/11/18 0811  GLUCAP 113* 119* 113* 117*   Lipid Profile: No results for input(s): CHOL, HDL, LDLCALC, TRIG, CHOLHDL, LDLDIRECT in the last 72 hours. Thyroid Function Tests: No results for input(s): TSH, T4TOTAL, FREET4, T3FREE, THYROIDAB in the last 72 hours. Anemia Panel: No results for input(s): VITAMINB12, FOLATE, FERRITIN, TIBC, IRON, RETICCTPCT in the last 72 hours. Sepsis Labs: No results for input(s):  PROCALCITON, LATICACIDVEN in the last 168 hours.  Recent Results (from the past 240 hour(s))  SARS Coronavirus 2 (CEPHEID - Performed in [place] hospital lab), Hosp Order     Status: None   Collection Time: 08/10/18  1:53 AM   Specimen: Nasopharyngeal Swab  Result Value Ref Range Status   SARS Coronavirus 2 NEGATIVE NEGATIVE Final    Comment: (NOTE) If result is NEGATIVE SARS-CoV-2 target nucleic acids are NOT DETECTED. The SARS-CoV-2 RNA is generally detectable in upper and lower  respiratory specimens during the acute phase of infection. The lowest  concentration of SARS-CoV-2 viral copies this assay can detect is 250  copies / mL. A negative result does not preclude SARS-CoV-2 infection  and should not be used as the sole basis for treatment or other  patient management decisions.  A negative result may occur with  improper specimen collection / handling, submission of specimen other  than nasopharyngeal swab, presence of viral mutation(s) within the  areas targeted by this assay, and inadequate number of viral copies  (<250 copies / mL). A negative result must be combined with clinical  observations, patient history, and epidemiological information. If result is POSITIVE SARS-CoV-2 target nucleic acids are DETECTED. The SARS-CoV-2 RNA is generally detectable  in upper and lower  respiratory specimens dur ing the acute phase of infection.  Positive  results are indicative of active infection with SARS-CoV-2.  Clinical  correlation with patient history and other diagnostic information is  necessary to determine patient infection status.  Positive results do  not rule out bacterial infection or co-infection with other viruses. If result is PRESUMPTIVE POSTIVE SARS-CoV-2 nucleic acids MAY BE PRESENT.   A presumptive positive result was obtained on the submitted specimen  and confirmed on repeat testing.  While 2019 novel coronavirus  (SARS-CoV-2) nucleic acids may be present in  the submitted sample  additional confirmatory testing may be necessary for epidemiological  and / or clinical management purposes  to differentiate between  SARS-CoV-2 and other Sarbecovirus currently known to infect humans.  If clinically indicated additional testing with an alternate test  methodology [phone]) is advised. The SARS-CoV-2 RNA is generally  detectable in upper and lower respiratory sp ecimens during the acute  phase of infection. The expected result is Negative. Fact Sheet for Patients:  StrictlyIdeas.no Fact Sheet for Healthcare Providers: BankingDealers.co.za This test is not yet approved or cleared by the [place] FDA and has been authorized for detection and/or diagnosis of SARS-CoV-2 by FDA under an Emergency Use Authorization (EUA).  This EUA will remain in effect (meaning this test can be used) for the duration of the COVID-19 declaration under Section 564(b)(1) of the Act, 21 U.S.C. section 360bbb-3(b)(1), unless the authorization is terminated or revoked sooner. Performed at [facility], [place] [address], [place], [place] [address]          Radiology Studies: Ct Abdomen Pelvis W Contrast  Result Date: 08/09/2018 CLINICAL DATA:  Persistent abdominal pain since discharged 3 days ago for small bowel obstruction. EXAM: CT ABDOMEN AND PELVIS WITH CONTRAST TECHNIQUE: Multidetector CT imaging of the abdomen and pelvis was performed using the standard protocol following bolus administration of intravenous contrast. CONTRAST:  141mL OMNIPAQUE IOHEXOL 300 MG/ML  SOLN COMPARISON:  CT abdomen pelvis dated August 05, 2018. FINDINGS: Lower chest: No acute abnormality. New subsegmental atelectasis in the right lower lobe. Unchanged scarring at the left lung base. Hepatobiliary: No focal liver abnormality is seen. No gallstones, gallbladder wall thickening, or biliary dilatation. Pancreas: Mild atrophy. No  ductal dilatation or surrounding inflammatory changes. Spleen: Normal in size without focal abnormality. Adrenals/Urinary Tract: Unchanged small bilateral adrenal adenomas. No renal calculi, focal lesion, or hydronephrosis. The bladder is unremarkable. Stomach/Bowel: Persistent diffuse small bowel dilatation to the level of the mid ileum, with transition point in the right lower quadrant (series 2, image 54; series 5, image 56). Mild edema in the small bowel mesentery is unchanged. No pneumatosis. The distal ileum and colon are decompressed. Mild left-sided colonic diverticulosis again noted. Normal appendix. Vascular/Lymphatic: Prior stent graft repair of an infrarenal abdominal aortic aneurysm. Aortoiliac atherosclerotic vascular disease. No enlarged abdominal or pelvic lymph nodes. Reproductive: Uterus and bilateral adnexa are unremarkable. Other: Unchanged trace free fluid in the pelvis. No pneumoperitoneum. Musculoskeletal: No acute or significant osseous findings. IMPRESSION: 1. Persistent distal small bowel obstruction with transition point in the right lower quadrant. Electronically Signed   By: Titus Dubin M.D.   On: 08/09/2018 20:33   Dg Abd Portable 1v-small Bowel Obstruction Protocol-initial, 8 Hr Delay  Result Date: 08/10/2018 CLINICAL DATA:  Small-bowel obstruction. EXAM: PORTABLE ABDOMEN - 1 VIEW COMPARISON:  August 10, 2018 FINDINGS: There are persistent dilated loops of small bowel scattered throughout the abdomen. There is  persistent oral contrast in the right hemicolon and transverse colon. This is not significantly changed from prior study. The nasogastric tube projects over the upper abdomen. Oral contrast is likely still within the small bowel. The patient is status post prior EVAR. IMPRESSION: 1. Oral contrast likely remains within the small bowel. 2. Persistent dilated loops of small bowel scattered throughout the abdomen, not significantly improved from prior study. 3. Oral contrast is  noted in the right hemicolon which is unchanged from radiograph dated 08/10/2018 at approximately 8:45 a.m. 4. Nasogastric tube projects over the left upper quadrant. Electronically Signed   By: Constance Holster M.D.   On: 08/10/2018 23:35   Dg Abd Portable 1v-small Bowel Protocol-position Verification  Result Date: 08/10/2018 CLINICAL DATA:  Nasogastric tube placement. EXAM: PORTABLE ABDOMEN - 1 VIEW COMPARISON:  CT scan of August 09, 2018. Radiographs of August 06, 2018. FINDINGS: Distal tip of nasogastric tube is seen in expected position of proximal stomach. Increased small bowel dilatation is noted concerning for distal small bowel obstruction. Residual contrast is noted in nondilated colon. Status post stent graft repair of abdominal aortic aneurysm. IMPRESSION: Nasogastric tube tip seen in expected position of proximal stomach. Increased small bowel dilatation is noted concerning for distal small bowel obstruction. Electronically Signed   By: Marijo Conception M.D.   On: 08/10/2018 09:23        Scheduled Meds:  diphenhydrAMINE  12.5 mg Intravenous Once   nicotine  7 mg Transdermal QHS   Continuous Infusions:  sodium chloride 250 mL (08/11/18 1045)   dextrose 5 % and 0.9 % NaCl with KCl 20 mEq/L 100 mL/hr at 08/11/18 1041   potassium chloride 10 mEq (08/11/18 1052)     LOS: 2 days   Time spent= 35 mins    Andrea Ferrer Arsenio Loader, MD [facility]  If 7PM-7AM, please contact night-coverage [email or web address] 08/11/2018, 12:07 PM

## 2018-08-11 NOTE — Progress Notes (Signed)
Patient ambulated entire length of east unit 3 times.  Patient tolerated well.  Now resting in chair NG in place to low intermittent suction.

## 2018-08-12 ENCOUNTER — Inpatient Hospital Stay (HOSPITAL_COMMUNITY): Payer: Medicare HMO

## 2018-08-12 LAB — BASIC METABOLIC PANEL
Anion gap: 8 (ref 5–15)
Anion gap: 5 (ref 5–15)
BUN: 16 mg/dL (ref 8–23)
BUN: 16 mg/dL (ref 8–23)
CO2: 26 mmol/L (ref 22–32)
CO2: 25 mmol/L (ref 22–32)
Calcium: 8.6 mg/dL — ABNORMAL LOW (ref 8.9–10.3)
Calcium: 8.8 mg/dL — ABNORMAL LOW (ref 8.9–10.3)
Chloride: 109 mmol/L (ref 98–111)
Chloride: 113 mmol/L — ABNORMAL HIGH (ref 98–111)
Creatinine, Ser: 0.51 mg/dL (ref 0.44–1.00)
Creatinine, Ser: 0.5 mg/dL (ref 0.44–1.00)
GFR calc Af Amer: >60 mL/min (ref >60)
GFR calc Af Amer: >60 mL/min (ref >60)
GFR calc non Af Amer: >60 mL/min (ref >60)
GFR calc non Af Amer: >60 mL/min (ref >60)
Glucose, Bld: 109 mg/dL — ABNORMAL HIGH (ref 70–99)
Glucose, Bld: 104 mg/dL — ABNORMAL HIGH (ref 70–99)
Potassium: 3.3 mmol/L — ABNORMAL LOW (ref 3.5–5.1)
Potassium: 3.4 mmol/L — ABNORMAL LOW (ref 3.5–5.1)
Sodium: 143 mmol/L (ref 135–145)
Sodium: 143 mmol/L (ref 135–145)

## 2018-08-12 LAB — CBC
HCT: 37.8 % (ref 36.0–46.0)
Hemoglobin: 12 g/dL (ref 12.0–15.0)
MCH: 29.8 pg (ref 26.0–34.0)
MCHC: 31.7 g/dL (ref 30.0–36.0)
MCV: 93.8 fL (ref 80.0–100.0)
Platelets: 329 10*3/uL (ref 150–400)
RBC: 4.03 MIL/uL (ref 3.87–5.11)
RDW: 14 % (ref 11.5–15.5)
WBC: 10.2 10*3/uL (ref 4.0–10.5)
nRBC: 0 % (ref 0.0–0.2)

## 2018-08-12 LAB — GLUCOSE, CAPILLARY
Glucose-Capillary: 96 mg/dL (ref 70–99)
Glucose-Capillary: 89 mg/dL (ref 70–99)
Glucose-Capillary: 86 mg/dL (ref 70–99)

## 2018-08-12 LAB — HEMOGLOBIN A1C
Hgb A1c MFr Bld: 5.6 % (ref 4.8–5.6)
Mean Plasma Glucose: 114.02 mg/dL

## 2018-08-12 LAB — MAGNESIUM: Magnesium: 2.1 mg/dL (ref 1.7–2.4)

## 2018-08-12 MED ORDER — POTASSIUM CHLORIDE 10 MEQ/100ML IV SOLN
10.0000 meq | INTRAVENOUS | Status: AC
  Administered 2018-08-12 (×4): 10 meq via INTRAVENOUS
  Filled 2018-08-12 (×4): qty 100

## 2018-08-12 MED ORDER — INSULIN ASPART 100 UNIT/ML ~~LOC~~ SOLN: 0.0000 [IU] | Freq: Three times a day (TID) | SUBCUTANEOUS | Status: DC

## 2018-08-12 MED ORDER — POTASSIUM CHLORIDE 10 MEQ/100ML IV SOLN
10.0000 meq | INTRAVENOUS | Status: AC
  Administered 2018-08-12: 10 meq via INTRAVENOUS
  Filled 2018-08-12: qty 100

## 2018-08-12 MED ORDER — DEXTROSE-NACL 5-0.45 % IV SOLN
INTRAVENOUS | Status: AC
  Administered 2018-08-12 (×2): via INTRAVENOUS

## 2018-08-12 MED ORDER — LORAZEPAM 2 MG/ML IJ SOLN
0.5000 mg | Freq: Once | INTRAMUSCULAR | Status: AC
  Administered 2018-08-12: 0.5 mg via INTRAVENOUS
  Filled 2018-08-12: qty 1

## 2018-08-12 MED ORDER — INSULIN ASPART 100 UNIT/ML ~~LOC~~ SOLN: 0.0000 [IU] | Freq: Every day | SUBCUTANEOUS | Status: DC

## 2018-08-12 NOTE — Progress Notes (Signed)
Patient ambulated around unit three times.  Tolerated well.  Returned to chair.

## 2018-08-12 NOTE — Care Management Important Message (Signed)
Important Message  Patient Details IM Letter given to Dessa Phi RN to present to the Patient Name: Gloria Morrison MRN: [id number] Date of Birth: 1950/06/30   Medicare Important Message Given:  Yes     Re[place], [place] 08/12/2018, 10:33 AM

## 2018-08-12 NOTE — Progress Notes (Signed)
PROGRESS NOTE    Gloria Morrison  PPI:[id number] DOB: October 31, 1950 DOA: 08/09/2018 PCP: Berkley Harvey, NP   Brief Narrative:  68 year old with history of GERD, tobacco use, essential hypertension, abdominal aortic aneurysm repair about 4 years ago came to the hospital with abdominal pain.  Patient was admitted to the hospital for recurrent distal small bowel obstruction.  She was just at [facility] with the same issue treated conservatively.  Surgery team has been consulted. SBO managed conservatively.    Assessment & Plan:   Principal Problem:   SBO (small bowel obstruction) (HCC) Active Problems:   AAA (abdominal aortic aneurysm) without rupture (HCC)   HTN (hypertension)   Hypokalemia  Distal small bowel obstruction with transition point in right lower quadrant; improving.  Abdominal pain with nausea -Had bowel movement this morning.  Clamp her NG tube, clear liquid diet.  Slowly advance as tolerated.  Surgery team following. -Aggressively replete electrolytes  Hypokalemia - Replete  Essential hypertension - Amlodipine is currently on hold.  GERD -PPI been able to take oral.  History of abdominal aortic aneurysm without rupture -Status post endovascular repair.   VT prophylaxis: SCDs Code Status: Full code Family Communication: None at bedside Disposition Plan: Maintain hospital stay to ensure she is able to tolerate oral diet.  Unsafe for discharge  Consultants:   General surgery  Procedures:  None Antimicrobials:   None   Subjective: Patient had a large bowel movement this morning and able to pass gas.  She is entering the hallway without much issues.  She does have 250 cc of output in the NG tube.  She is adamant she was to try oral diet therefore she is agreeable to clamp her tube and start clear liquid diet.  Review of Systems Otherwise negative except as per HPI, including: General = no fevers, chills, dizziness, malaise, fatigue  HEENT/EYES = negative for pain, redness, loss of vision, double vision, blurred vision, loss of hearing, sore throat, hoarseness, dysphagia Cardiovascular= negative for chest pain, palpitation, murmurs, lower extremity swelling Respiratory/lungs= negative for shortness of breath, cough, hemoptysis, wheezing, mucus production Gastrointestinal= negative for nausea, vomiting,, abdominal pain, melena, hematemesis Genitourinary= negative for Dysuria, Hematuria, Change in Urinary Frequency MSK = Negative for arthralgia, myalgias, Back Pain, Joint swelling  Neurology= Negative for headache, seizures, numbness, tingling  Psychiatry= Negative for anxiety, depression, suicidal and homocidal ideation Allergy/Immunology= Medication/Food allergy as listed  Skin= Negative for Rash, lesions, ulcers, itching    Objective: Vitals:   08/11/18 1358 08/11/18 2130 08/12/18 0623 08/12/18 0629  BP: 119/86 (!) 164/88  124/87  Pulse: 65 (!) 59  83  Resp:  14  15  Temp: 98.2 F (36.8 C) (!) 97.4 F (36.3 C)  97.8 F (36.6 C)  TempSrc: Oral Oral  Oral  SpO2: 94% 94%  95%  Weight:   66.6 kg   Height:        Intake/Output Summary (Last 24 hours) at 08/12/2018 1211 Last data filed at 08/12/2018 0630 Gross per 24 hour  Intake 2230.89 ml  Output 1900 ml  Net 330.89 ml   Filed Weights   08/11/18 0532 08/12/18 0623  Weight: 68.1 kg 66.6 kg    Examination: Constitutional: NAD, calm, comfortable, NG tube in place Eyes: PERRL, lids and conjunctivae normal ENMT: Mucous membranes are moist. Posterior pharynx clear of any exudate or lesions.Normal dentition.  Neck: normal, supple, no masses, no thyromegaly Respiratory: clear to auscultation bilaterally, no wheezing, no crackles. Normal respiratory effort. No  accessory muscle use.  Cardiovascular: Regular rate and rhythm, no murmurs / rubs / gallops. No extremity edema. 2+ pedal pulses. No carotid bruits.  Abdomen: no tenderness, no masses palpated. No  hepatosplenomegaly. Bowel sounds positive.  Musculoskeletal: no clubbing / cyanosis. No joint deformity upper and lower extremities. Good ROM, no contractures. Normal muscle tone.  Skin: no rashes, lesions, ulcers. No induration Neurologic: CN 2-12 grossly intact. Sensation intact, DTR normal. Strength 5/5 in all 4.  Psychiatric: Normal judgment and insight. Alert and oriented x 3. Normal mood.    Data Reviewed:   CBC: Recent Labs  Lab 08/09/18 1859 08/10/18 0630 08/11/18 0508 08/12/18 0517  WBC 10.4 9.4 9.7 10.2  NEUTROABS  --  6.4  --   --   HGB 13.3 12.9 13.3 12.0  HCT 40.6 38.0 40.6 37.8  MCV 90.0 90.7 92.9 93.8  PLT 346 299 332 209   Basic Metabolic Panel: Recent Labs  Lab 08/09/18 1859 08/10/18 0630 08/11/18 0508 08/12/18 0517 08/12/18 1031  NA 132* 133* 141 143 143  K 2.8* 3.3* 3.1* 3.3* 3.4*  CL 95* 101 107 109 113*  CO2 23 21* 22 26 25   GLUCOSE 92 141* 113* 109* 104*  BUN 17 10 12 16 16   CREATININE 0.55 0.45 0.52 0.51 0.50  CALCIUM 9.5 8.5* 8.6* 8.6* 8.8*  MG  --   --  1.9 2.1  --    GFR: Estimated Creatinine Clearance: 64.8 mL/min (by C-G formula based on SCr of 0.5 mg/dL). Liver Function Tests: Recent Labs  Lab 08/09/18 1859 08/10/18 0630  AST 22 21  ALT 17 16  ALKPHOS 75 68  BILITOT 0.8 0.6  PROT 7.1 6.1*  ALBUMIN 3.8 3.4*   Recent Labs  Lab 08/09/18 1859  LIPASE 26   No results for input(s): AMMONIA in the last 168 hours. Coagulation Profile: No results for input(s): INR, PROTIME in the last 168 hours. Cardiac Enzymes: No results for input(s): CKTOTAL, CKMB, CKMBINDEX, TROPONINI in the last 168 hours. BNP (last 3 results) No results for input(s): PROBNP in the last 8760 hours. HbA1C: No results for input(s): HGBA1C in the last 72 hours. CBG: Recent Labs  Lab 08/11/18 0111 08/11/18 0811 08/11/18 1622 08/11/18 2356 08/12/18 0857  GLUCAP 113* 117* 86 112* 96   Lipid Profile: No results for input(s): CHOL, HDL, LDLCALC, TRIG,  CHOLHDL, LDLDIRECT in the last 72 hours. Thyroid Function Tests: No results for input(s): TSH, T4TOTAL, FREET4, T3FREE, THYROIDAB in the last 72 hours. Anemia Panel: No results for input(s): VITAMINB12, FOLATE, FERRITIN, TIBC, IRON, RETICCTPCT in the last 72 hours. Sepsis Labs: No results for input(s): PROCALCITON, LATICACIDVEN in the last 168 hours.  Recent Results (from the past 240 hour(s))  SARS Coronavirus 2 (CEPHEID - Performed in [place] hospital lab), Hosp Order     Status: None   Collection Time: 08/10/18  1:53 AM   Specimen: Nasopharyngeal Swab  Result Value Ref Range Status   SARS Coronavirus 2 NEGATIVE NEGATIVE Final    Comment: (NOTE) If result is NEGATIVE SARS-CoV-2 target nucleic acids are NOT DETECTED. The SARS-CoV-2 RNA is generally detectable in upper and lower  respiratory specimens during the acute phase of infection. The lowest  concentration of SARS-CoV-2 viral copies this assay can detect is 250  copies / mL. A negative result does not preclude SARS-CoV-2 infection  and should not be used as the sole basis for treatment or other  patient management decisions.  A negative result may occur with  improper specimen collection / handling, submission of specimen other  than nasopharyngeal swab, presence of viral mutation(s) within the  areas targeted by this assay, and inadequate number of viral copies  (<250 copies / mL). A negative result must be combined with clinical  observations, patient history, and epidemiological information. If result is POSITIVE SARS-CoV-2 target nucleic acids are DETECTED. The SARS-CoV-2 RNA is generally detectable in upper and lower  respiratory specimens dur ing the acute phase of infection.  Positive  results are indicative of active infection with SARS-CoV-2.  Clinical  correlation with patient history and other diagnostic information is  necessary to determine patient infection status.  Positive results do  not rule out  bacterial infection or co-infection with other viruses. If result is PRESUMPTIVE POSTIVE SARS-CoV-2 nucleic acids MAY BE PRESENT.   A presumptive positive result was obtained on the submitted specimen  and confirmed on repeat testing.  While 2019 novel coronavirus  (SARS-CoV-2) nucleic acids may be present in the submitted sample  additional confirmatory testing may be necessary for epidemiological  and / or clinical management purposes  to differentiate between  SARS-CoV-2 and other Sarbecovirus currently known to infect humans.  If clinically indicated additional testing with an alternate test  methodology [phone]) is advised. The SARS-CoV-2 RNA is generally  detectable in upper and lower respiratory sp ecimens during the acute  phase of infection. The expected result is Negative. Fact Sheet for Patients:  StrictlyIdeas.no Fact Sheet for Healthcare Providers: BankingDealers.co.za This test is not yet approved or cleared by the [place] FDA and has been authorized for detection and/or diagnosis of SARS-CoV-2 by FDA under an Emergency Use Authorization (EUA).  This EUA will remain in effect (meaning this test can be used) for the duration of the COVID-19 declaration under Section 564(b)(1) of the Act, 21 U.S.C. section 360bbb-3(b)(1), unless the authorization is terminated or revoked sooner. Performed at [facility], [place] [address] [place], [place] [address]          Radiology Studies: Dg Abd Portable 1v  Result Date: 08/12/2018 CLINICAL DATA:  Small bowel obstruction EXAM: PORTABLE ABDOMEN - 1 VIEW COMPARISON:  Yesterday FINDINGS: Gastric suction tube tip at the proximal stomach. Dilated small bowel measuring up to 3.7 cm, similar to prior. Less numerous loops of dilated bowel are seen. Oral contrast is still seen within nondilated colon. Aortic iliac stent grafting. No concerning mass effect or gas  collection. IMPRESSION: Stable to mildly improved partial small bowel obstruction. Electronically Signed   By: Monte Fantasia M.D.   On: 08/12/2018 07:26   Dg Abd Portable 1v  Result Date: 08/11/2018 CLINICAL DATA:  Small bowel obstruction. EXAM: PORTABLE ABDOMEN - 1 VIEW COMPARISON:  Radiograph of August 10, 2018. FINDINGS: Distal tip of nasogastric tube is stable in proximal stomach. Status post stent graft repair of abdominal aortic aneurysm. Persistent small bowel dilatation is noted concerning for distal small bowel obstruction. Residual contrast remains within the colon which is nondilated. IMPRESSION: Stable small bowel dilatation is noted concerning for distal small bowel obstruction. Electronically Signed   By: Marijo Conception M.D.   On: 08/11/2018 12:49   Dg Abd Portable 1v-small Bowel Obstruction Protocol-initial, 8 Hr Delay  Result Date: 08/10/2018 CLINICAL DATA:  Small-bowel obstruction. EXAM: PORTABLE ABDOMEN - 1 VIEW COMPARISON:  August 10, 2018 FINDINGS: There are persistent dilated loops of small bowel scattered throughout the abdomen. There is persistent oral contrast in the right hemicolon and transverse colon. This is not  significantly changed from prior study. The nasogastric tube projects over the upper abdomen. Oral contrast is likely still within the small bowel. The patient is status post prior EVAR. IMPRESSION: 1. Oral contrast likely remains within the small bowel. 2. Persistent dilated loops of small bowel scattered throughout the abdomen, not significantly improved from prior study. 3. Oral contrast is noted in the right hemicolon which is unchanged from radiograph dated 08/10/2018 at approximately 8:45 a.m. 4. Nasogastric tube projects over the left upper quadrant. Electronically Signed   By: Constance Holster M.D.   On: 08/10/2018 23:35      Scheduled Meds: . diphenhydrAMINE  12.5 mg Intravenous Once  . nicotine  7 mg Transdermal QHS   Continuous Infusions: . sodium  chloride 10 mL/hr at 08/12/18 0615  . dextrose 5 % and 0.45% NaCl 75 mL/hr at 08/12/18 0945  . potassium chloride       LOS: 3 days   Time spent= 35 mins    Ankit Arsenio Loader, MD [facility]  If 7PM-7AM, please contact night-coverage [email or web address] 08/12/2018, 12:11 PM

## 2018-08-12 NOTE — Progress Notes (Signed)
Subjective: CC: Hungry  Patient reports that she is doing well this morning. She had a large BM this morning and now feels much better. Denies any abdominal pain since having BM. Passing flatus. Denies any N/V. Reports she is hungry now, which she has not been for the last 1 week. Reports she wants to go home tomorrow because she will not have anyone to watch her baby and dog at home.    Objective: Vital signs in last 24 hours: Temp:  [97.4 F (36.3 C)-98.2 F (36.8 C)] 97.8 F (36.6 C) (06/25 0629) Pulse Rate:  [59-83] 83 (06/25 0629) Resp:  [14-15] 15 (06/25 0629) BP: (119-164)/(86-88) 124/87 (06/25 0629) SpO2:  [94 %-95 %] 95 % (06/25 0629) Weight:  [66.6 kg] 66.6 kg (06/25 0623) Last BM Date: 08/12/18  Intake/Output from previous day: 06/24 0701 - 06/25 0700 In: 2230.9 [I.V.:1830.9; IV Piggyback:400] Out: 1900 [Urine:600; Emesis/NG output:1300] Intake/Output this shift: No intake/output data recorded.  PE: Gen: Awake and alert,NAD Lungs: Normal effort Abd: Soft, less to no distension, some tenderness of the epigastrium without r/r/g. +BS. NG tube in place. 1300cc/24 hours. Dark/Bilious  Msk: no edema  Lab Results:  Recent Labs    08/11/18 0508 08/12/18 0517  WBC 9.7 10.2  HGB 13.3 12.0  HCT 40.6 37.8  PLT 332 329   BMET Recent Labs    08/12/18 0517 08/12/18 1031  NA 143 143  K 3.3* 3.4*  CL 109 113*  CO2 26 25  GLUCOSE 109* 104*  BUN 16 16  CREATININE 0.51 0.50  CALCIUM 8.6* 8.8*   PT/INR No results for input(s): LABPROT, INR in the last 72 hours. CMP     Component Value Date/Time   NA 143 08/12/2018 1031   K 3.4 (L) 08/12/2018 1031   CL 113 (H) 08/12/2018 1031   CO2 25 08/12/2018 1031   GLUCOSE 104 (H) 08/12/2018 1031   BUN 16 08/12/2018 1031   CREATININE 0.50 08/12/2018 1031   CREATININE 0.49 (L) 01/19/2015 1239   CALCIUM 8.8 (L) 08/12/2018 1031   PROT 6.1 (L) 08/10/2018 0630   ALBUMIN 3.4 (L) 08/10/2018 0630   AST 21 08/10/2018  0630   ALT 16 08/10/2018 0630   ALKPHOS 68 08/10/2018 0630   BILITOT 0.6 08/10/2018 0630   GFRNONAA >60 08/12/2018 1031   GFRAA >60 08/12/2018 1031   Lipase     Component Value Date/Time   LIPASE 26 08/09/2018 1859       Studies/Results: Dg Abd Portable 1v  Result Date: 08/12/2018 CLINICAL DATA:  Small bowel obstruction EXAM: PORTABLE ABDOMEN - 1 VIEW COMPARISON:  Yesterday FINDINGS: Gastric suction tube tip at the proximal stomach. Dilated small bowel measuring up to 3.7 cm, similar to prior. Less numerous loops of dilated bowel are seen. Oral contrast is still seen within nondilated colon. Aortic iliac stent grafting. No concerning mass effect or gas collection. IMPRESSION: Stable to mildly improved partial small bowel obstruction. Electronically Signed   By: Monte Fantasia M.D.   On: 08/12/2018 07:26   Dg Abd Portable 1v  Result Date: 08/11/2018 CLINICAL DATA:  Small bowel obstruction. EXAM: PORTABLE ABDOMEN - 1 VIEW COMPARISON:  Radiograph of August 10, 2018. FINDINGS: Distal tip of nasogastric tube is stable in proximal stomach. Status post stent graft repair of abdominal aortic aneurysm. Persistent small bowel dilatation is noted concerning for distal small bowel obstruction. Residual contrast remains within the colon which is nondilated. IMPRESSION: Stable small bowel dilatation is  noted concerning for distal small bowel obstruction. Electronically Signed   By: Marijo Conception M.D.   On: 08/11/2018 12:49   Dg Abd Portable 1v-small Bowel Obstruction Protocol-initial, 8 Hr Delay  Result Date: 08/10/2018 CLINICAL DATA:  Small-bowel obstruction. EXAM: PORTABLE ABDOMEN - 1 VIEW COMPARISON:  August 10, 2018 FINDINGS: There are persistent dilated loops of small bowel scattered throughout the abdomen. There is persistent oral contrast in the right hemicolon and transverse colon. This is not significantly changed from prior study. The nasogastric tube projects over the upper abdomen. Oral  contrast is likely still within the small bowel. The patient is status post prior EVAR. IMPRESSION: 1. Oral contrast likely remains within the small bowel. 2. Persistent dilated loops of small bowel scattered throughout the abdomen, not significantly improved from prior study. 3. Oral contrast is noted in the right hemicolon which is unchanged from radiograph dated 08/10/2018 at approximately 8:45 a.m. 4. Nasogastric tube projects over the left upper quadrant. Electronically Signed   By: Constance Holster M.D.   On: 08/10/2018 23:35    Anti-infectives: Anti-infectives (From admission, onward)   None       Assessment/Plan GERD HTN Tobacco abuse Hx of aortic aneurysm status post repair 4 years ago (Endovascular)   SBO - Recent admission to [facility] on 6/18 for SBO that resolved with conservative management - Keep K > 4.0 and Mg > 2.0 for bowel function - AM xray improved with contrast advancing to splenic flexure. Pt having bowel function - Plan to clamp NG tube, start CLD, and recheck later today for possible removal of NGT and advancement of diet - Mobilize for bowel function - We will continue to follow  ID -None VTE -SCDs,okay for chemical prophylaxis from a surgical standpoint FEN -CLD, NGT clamped, Mg 2.1, K 3.4 (being replaced)   LOS: 3 days    Jillyn Ledger , [facility] Surgery 08/12/2018, 11:21 AM Pager: [phone]

## 2018-08-13 ENCOUNTER — Inpatient Hospital Stay (HOSPITAL_COMMUNITY): Payer: Medicare HMO

## 2018-08-13 LAB — GLUCOSE, CAPILLARY
Glucose-Capillary: 95 mg/dL (ref 70–99)
Glucose-Capillary: 94 mg/dL (ref 70–99)
Glucose-Capillary: 92 mg/dL (ref 70–99)
Glucose-Capillary: 86 mg/dL (ref 70–99)

## 2018-08-13 LAB — CBC
HCT: 36.8 % (ref 36.0–46.0)
Hemoglobin: 12 g/dL (ref 12.0–15.0)
MCH: 30.2 pg (ref 26.0–34.0)
MCHC: 32.6 g/dL (ref 30.0–36.0)
MCV: 92.7 fL (ref 80.0–100.0)
Platelets: 319 10*3/uL (ref 150–400)
RBC: 3.97 MIL/uL (ref 3.87–5.11)
RDW: 13.8 % (ref 11.5–15.5)
WBC: 10.9 10*3/uL — ABNORMAL HIGH (ref 4.0–10.5)
nRBC: 0 % (ref 0.0–0.2)

## 2018-08-13 LAB — BASIC METABOLIC PANEL
Anion gap: 9 (ref 5–15)
BUN: 10 mg/dL (ref 8–23)
CO2: 24 mmol/L (ref 22–32)
Calcium: 8.6 mg/dL — ABNORMAL LOW (ref 8.9–10.3)
Chloride: 103 mmol/L (ref 98–111)
Creatinine, Ser: 0.47 mg/dL (ref 0.44–1.00)
GFR calc Af Amer: >60 mL/min (ref >60)
GFR calc non Af Amer: >60 mL/min (ref >60)
Glucose, Bld: 104 mg/dL — ABNORMAL HIGH (ref 70–99)
Potassium: 3 mmol/L — ABNORMAL LOW (ref 3.5–5.1)
Sodium: 136 mmol/L (ref 135–145)

## 2018-08-13 LAB — MAGNESIUM: Magnesium: 1.7 mg/dL (ref 1.7–2.4)

## 2018-08-13 MED ORDER — POTASSIUM CHLORIDE CRYS ER 20 MEQ PO TBCR
40.0000 meq | EXTENDED_RELEASE_TABLET | ORAL | Status: AC
  Administered 2018-08-13 (×2): 40 meq via ORAL
  Filled 2018-08-13 (×2): qty 2

## 2018-08-13 MED ORDER — MAGNESIUM OXIDE 400 (241.3 MG) MG PO TABS
800.0000 mg | ORAL_TABLET | ORAL | Status: AC
  Administered 2018-08-13 (×2): 800 mg via ORAL
  Filled 2018-08-13 (×2): qty 2

## 2018-08-13 MED ORDER — ALPRAZOLAM 0.25 MG PO TABS
0.2500 mg | ORAL_TABLET | Freq: Once | ORAL | Status: AC
  Administered 2018-08-13: 0.25 mg via ORAL
  Filled 2018-08-13: qty 1

## 2018-08-13 MED ORDER — LORAZEPAM 2 MG/ML IJ SOLN: 0.5000 mg | Freq: Once | INTRAMUSCULAR | Status: DC

## 2018-08-13 NOTE — Progress Notes (Signed)
PROGRESS NOTE    Gloria Morrison  QIW:[id number] DOB: April 24, 1950 DOA: 08/09/2018 PCP: Berkley Harvey, NP   Brief Narrative:  68 year old with history of GERD, tobacco use, essential hypertension, abdominal aortic aneurysm repair about 4 years ago came to the hospital with abdominal pain.  Patient was admitted to the hospital for recurrent distal small bowel obstruction.  She was just at [facility] with the same issue treated conservatively.  Surgery team has been consulted. SBO managed conservatively.    Assessment & Plan:   Principal Problem:   SBO (small bowel obstruction) (HCC) Active Problems:   AAA (abdominal aortic aneurysm) without rupture (HCC)   HTN (hypertension)   Hypokalemia  Distal small bowel obstruction with transition point in right lower quadrant; improving.  Abdominal pain with nausea -Continue to leave her NG tube clamped.  Diet as tolerated.  X-ray does not show much improvement therefore will continue clear liquid diet today.  General surgery team following. -Aggressively replete electrolytes  Hypokalemia - Replete aggressively  Essential hypertension - Amlodipine is currently on hold.  GERD -PPI been able to take oral.  History of abdominal aortic aneurysm without rupture -Status post endovascular repair.  I have explained patient that she is cleared to be discharged from medical and surgical standpoint.  She understands the risk of leaving [place].  VT prophylaxis: SCDs Code Status: Full code Family Communication: None at bedside Disposition Plan: Maintain hospital stay until abdominal symptoms improved  Consultants:   General surgery  Procedures:  None Antimicrobials:   None   Subjective: Tolerating clear liquid diet but patient continues to change her story.  At first she tells me she is feeling much better passing gas therefore wants to be discharged later on goes on telling me that her abdomen still  feels bloated and wants surgical intervention ASAP. After extensive discussion with the patient, I have tried to explain her why conservative management.  Review of Systems Otherwise negative except as per HPI, including: General = no fevers, chills, dizziness, malaise, fatigue HEENT/EYES = negative for pain, redness, loss of vision, double vision, blurred vision, loss of hearing, sore throat, hoarseness, dysphagia Cardiovascular= negative for chest pain, palpitation, murmurs, lower extremity swelling Respiratory/lungs= negative for shortness of breath, cough, hemoptysis, wheezing, mucus production Gastrointestinal= abdominal distention and bloating Genitourinary= negative for Dysuria, Hematuria, Change in Urinary Frequency MSK = Negative for arthralgia, myalgias, Back Pain, Joint swelling  Neurology= Negative for headache, seizures, numbness, tingling  Psychiatry= Negative for anxiety, depression, suicidal and homocidal ideation Allergy/Immunology= Medication/Food allergy as listed  Skin= Negative for Rash, lesions, ulcers, itching    Objective: Vitals:   08/12/18 0629 08/12/18 1300 08/12/18 2100 08/13/18 0650  BP: 124/87 (!) 165/89 (!) 168/84 (!) 163/95  Pulse: 83 64 (!) 56 68  Resp: 15 16 12 20   Temp: 97.8 F (36.6 C) 98.5 F (36.9 C) 98.7 F (37.1 C) 98.4 F (36.9 C)  TempSrc: Oral Oral Oral Oral  SpO2: 95% 93% 94% 94%  Weight:    69.9 kg  Height:        Intake/Output Summary (Last 24 hours) at 08/13/2018 1120 Last data filed at 08/13/2018 0600 Gross per 24 hour  Intake 3219.64 ml  Output -  Net 3219.64 ml   Filed Weights   08/11/18 0532 08/12/18 0623 08/13/18 0650  Weight: 68.1 kg 66.6 kg 69.9 kg    Examination: Constitutional: NAD, calm, comfortable, NG tube currently in place which is clamped Eyes: PERRL, lids and  conjunctivae normal ENMT: Mucous membranes are moist. Posterior pharynx clear of any exudate or lesions.Normal dentition.  Neck: normal, supple, no  masses, no thyromegaly Respiratory: clear to auscultation bilaterally, no wheezing, no crackles. Normal respiratory effort. No accessory muscle use.  Cardiovascular: Regular rate and rhythm, no murmurs / rubs / gallops. No extremity edema. 2+ pedal pulses. No carotid bruits.  Abdomen: Abdominal distention noted.  Slight tenderness to deep palpation. Musculoskeletal: no clubbing / cyanosis. No joint deformity upper and lower extremities. Good ROM, no contractures. Normal muscle tone.  Skin: no rashes, lesions, ulcers. No induration Neurologic: CN 2-12 grossly intact. Sensation intact, DTR normal. Strength 5/5 in all 4.  Psychiatric: Normal judgment and insight. Alert and oriented x 3. Normal mood.    Data Reviewed:   CBC: Recent Labs  Lab 08/09/18 1859 08/10/18 0630 08/11/18 0508 08/12/18 0517 08/13/18 0453  WBC 10.4 9.4 9.7 10.2 10.9*  NEUTROABS  --  6.4  --   --   --   HGB 13.3 12.9 13.3 12.0 12.0  HCT 40.6 38.0 40.6 37.8 36.8  MCV 90.0 90.7 92.9 93.8 92.7  PLT 346 299 332 329 659   Basic Metabolic Panel: Recent Labs  Lab 08/10/18 0630 08/11/18 0508 08/12/18 0517 08/12/18 1031 08/13/18 0453  NA 133* 141 143 143 136  K 3.3* 3.1* 3.3* 3.4* 3.0*  CL 101 107 109 113* 103  CO2 21* 22 26 25 24   GLUCOSE 141* 113* 109* 104* 104*  BUN 10 12 16 16 10   CREATININE 0.45 0.52 0.51 0.50 0.47  CALCIUM 8.5* 8.6* 8.6* 8.8* 8.6*  MG  --  1.9 2.1  --  1.7   GFR: Estimated Creatinine Clearance: 64.8 mL/min (by C-G formula based on SCr of 0.47 mg/dL). Liver Function Tests: Recent Labs  Lab 08/09/18 1859 08/10/18 0630  AST 22 21  ALT 17 16  ALKPHOS 75 68  BILITOT 0.8 0.6  PROT 7.1 6.1*  ALBUMIN 3.8 3.4*   Recent Labs  Lab 08/09/18 1859  LIPASE 26   No results for input(s): AMMONIA in the last 168 hours. Coagulation Profile: No results for input(s): INR, PROTIME in the last 168 hours. Cardiac Enzymes: No results for input(s): CKTOTAL, CKMB, CKMBINDEX, TROPONINI in the  last 168 hours. BNP (last 3 results) No results for input(s): PROBNP in the last 8760 hours. HbA1C: Recent Labs    08/12/18 0517  HGBA1C 5.6   CBG: Recent Labs  Lab 08/11/18 2356 08/12/18 0857 08/12/18 1640 08/12/18 2103 08/13/18 0748  GLUCAP 112* 96 89 86 95   Lipid Profile: No results for input(s): CHOL, HDL, LDLCALC, TRIG, CHOLHDL, LDLDIRECT in the last 72 hours. Thyroid Function Tests: No results for input(s): TSH, T4TOTAL, FREET4, T3FREE, THYROIDAB in the last 72 hours. Anemia Panel: No results for input(s): VITAMINB12, FOLATE, FERRITIN, TIBC, IRON, RETICCTPCT in the last 72 hours. Sepsis Labs: No results for input(s): PROCALCITON, LATICACIDVEN in the last 168 hours.  Recent Results (from the past 240 hour(s))  SARS Coronavirus 2 (CEPHEID - Performed in [place] hospital lab), Hosp Order     Status: None   Collection Time: 08/10/18  1:53 AM   Specimen: Nasopharyngeal Swab  Result Value Ref Range Status   SARS Coronavirus 2 NEGATIVE NEGATIVE Final    Comment: (NOTE) If result is NEGATIVE SARS-CoV-2 target nucleic acids are NOT DETECTED. The SARS-CoV-2 RNA is generally detectable in upper and lower  respiratory specimens during the acute phase of infection. The lowest  concentration of SARS-CoV-2  viral copies this assay can detect is 250  copies / mL. A negative result does not preclude SARS-CoV-2 infection  and should not be used as the sole basis for treatment or other  patient management decisions.  A negative result may occur with  improper specimen collection / handling, submission of specimen other  than nasopharyngeal swab, presence of viral mutation(s) within the  areas targeted by this assay, and inadequate number of viral copies  (<250 copies / mL). A negative result must be combined with clinical  observations, patient history, and epidemiological information. If result is POSITIVE SARS-CoV-2 target nucleic acids are DETECTED. The SARS-CoV-2 RNA is  generally detectable in upper and lower  respiratory specimens dur ing the acute phase of infection.  Positive  results are indicative of active infection with SARS-CoV-2.  Clinical  correlation with patient history and other diagnostic information is  necessary to determine patient infection status.  Positive results do  not rule out bacterial infection or co-infection with other viruses. If result is PRESUMPTIVE POSTIVE SARS-CoV-2 nucleic acids MAY BE PRESENT.   A presumptive positive result was obtained on the submitted specimen  and confirmed on repeat testing.  While 2019 novel coronavirus  (SARS-CoV-2) nucleic acids may be present in the submitted sample  additional confirmatory testing may be necessary for epidemiological  and / or clinical management purposes  to differentiate between  SARS-CoV-2 and other Sarbecovirus currently known to infect humans.  If clinically indicated additional testing with an alternate test  methodology [phone]) is advised. The SARS-CoV-2 RNA is generally  detectable in upper and lower respiratory sp ecimens during the acute  phase of infection. The expected result is Negative. Fact Sheet for Patients:  StrictlyIdeas.no Fact Sheet for Healthcare Providers: BankingDealers.co.za This test is not yet approved or cleared by the [place] FDA and has been authorized for detection and/or diagnosis of SARS-CoV-2 by FDA under an Emergency Use Authorization (EUA).  This EUA will remain in effect (meaning this test can be used) for the duration of the COVID-19 declaration under Section 564(b)(1) of the Act, 21 U.S.C. section 360bbb-3(b)(1), unless the authorization is terminated or revoked sooner. Performed at [facility], [place] [address], [place], [place] [address]          Radiology Studies: Dg Abd Portable 1v  Result Date: 08/13/2018 CLINICAL DATA:  Small-bowel obstruction  EXAM: PORTABLE ABDOMEN - 1 VIEW COMPARISON:  Portable exam 9937 hours compared to 08/12/2018 FINDINGS: Aorto bi-iliac stent. Nasogastric tube projects over proximal stomach. Scattered retained contrast in colon. Persistent dilatation of numerous small bowel loops consistent with small bowel obstruction. No definite bowel wall thickening. Bones demineralized. IMPRESSION: Persistent small bowel dilatation consistent with small bowel obstruction, not significantly changed. Electronically Signed   By: Lavonia Dana M.D.   On: 08/13/2018 08:03   Dg Abd Portable 1v  Result Date: 08/12/2018 CLINICAL DATA:  Small bowel obstruction EXAM: PORTABLE ABDOMEN - 1 VIEW COMPARISON:  Yesterday FINDINGS: Gastric suction tube tip at the proximal stomach. Dilated small bowel measuring up to 3.7 cm, similar to prior. Less numerous loops of dilated bowel are seen. Oral contrast is still seen within nondilated colon. Aortic iliac stent grafting. No concerning mass effect or gas collection. IMPRESSION: Stable to mildly improved partial small bowel obstruction. Electronically Signed   By: Monte Fantasia M.D.   On: 08/12/2018 07:26      Scheduled Meds: . diphenhydrAMINE  12.5 mg Intravenous Once  . insulin aspart  0-5 Units  Subcutaneous QHS  . insulin aspart  0-9 Units Subcutaneous TID WC  . magnesium oxide  800 mg Oral Q4H  . nicotine  7 mg Transdermal QHS  . potassium chloride  40 mEq Oral Q4H   Continuous Infusions: . sodium chloride 10 mL/hr at 08/12/18 0615     LOS: 4 days   Time spent= 30 mins    Lynix Bonine Arsenio Loader, MD [facility]  If 7PM-7AM, please contact night-coverage [email or web address] 08/13/2018, 11:20 AM

## 2018-08-13 NOTE — Progress Notes (Signed)
Pt complaining of mild nausea after eating lunch. Increased abdominal distention noted. NG tube connected back to low intermittent suction. PA [employer] notified. Will continue to monitor closely.

## 2018-08-13 NOTE — Progress Notes (Signed)
Patient ID: Gloria Morrison, female   DOB: 08-26-1950, 68 y.o.   MRN: [id number]       Subjective: Patient is upset this morning and asking a lot of questions and wanting to leave but then demanding of surgery.  She initially states she is improving with less distention and less pain and tolerating clear liquids with no nausea.  However as the conversation progressed she states she was "still bloated and still having pain and doesn't that mean that I should have surgery..."  Objective: Vital signs in last 24 hours: Temp:  [98.4 F (36.9 C)-98.7 F (37.1 C)] 98.4 F (36.9 C) (06/26 0650) Pulse Rate:  [56-68] 68 (06/26 0650) Resp:  [12-20] 20 (06/26 0650) BP: (163-168)/(84-95) 163/95 (06/26 0650) SpO2:  [93 %-94 %] 94 % (06/26 0650) Weight:  [69.9 kg] 69.9 kg (06/26 0650) Last BM Date: 08/12/18  Intake/Output from previous day: 06/25 0701 - 06/26 0700 In: 3219.6 [P.O.:1080; I.V.:1645; IV Piggyback:494.7] Out: -  Intake/Output this shift: No intake/output data recorded.  PE: Gen: NAD Heart: regular Lungs: CTAB Abd: soft, but still bloated and mild tenderness in central part of abdomen.  She does have some BS.  NGT in place but clamped.  Lab Results:  Recent Labs    08/12/18 0517 08/13/18 0453  WBC 10.2 10.9*  HGB 12.0 12.0  HCT 37.8 36.8  PLT 329 319   BMET Recent Labs    08/12/18 1031 08/13/18 0453  NA 143 136  K 3.4* 3.0*  CL 113* 103  CO2 25 24  GLUCOSE 104* 104*  BUN 16 10  CREATININE 0.50 0.47  CALCIUM 8.8* 8.6*   PT/INR No results for input(s): LABPROT, INR in the last 72 hours. CMP     Component Value Date/Time   NA 136 08/13/2018 0453   K 3.0 (L) 08/13/2018 0453   CL 103 08/13/2018 0453   CO2 24 08/13/2018 0453   GLUCOSE 104 (H) 08/13/2018 0453   BUN 10 08/13/2018 0453   CREATININE 0.47 08/13/2018 0453   CREATININE 0.49 (L) 01/19/2015 1239   CALCIUM 8.6 (L) 08/13/2018 0453   PROT 6.1 (L) 08/10/2018 0630   ALBUMIN 3.4 (L) 08/10/2018 0630   AST 21 08/10/2018 0630   ALT 16 08/10/2018 0630   ALKPHOS 68 08/10/2018 0630   BILITOT 0.6 08/10/2018 0630   GFRNONAA >60 08/13/2018 0453   GFRAA >60 08/13/2018 0453   Lipase     Component Value Date/Time   LIPASE 26 08/09/2018 1859       Studies/Results: Dg Abd Portable 1v  Result Date: 08/13/2018 CLINICAL DATA:  Small-bowel obstruction EXAM: PORTABLE ABDOMEN - 1 VIEW COMPARISON:  Portable exam 2585 hours compared to 08/12/2018 FINDINGS: Aorto bi-iliac stent. Nasogastric tube projects over proximal stomach. Scattered retained contrast in colon. Persistent dilatation of numerous small bowel loops consistent with small bowel obstruction. No definite bowel wall thickening. Bones demineralized. IMPRESSION: Persistent small bowel dilatation consistent with small bowel obstruction, not significantly changed. Electronically Signed   By: Lavonia Dana M.D.   On: 08/13/2018 08:03   Dg Abd Portable 1v  Result Date: 08/12/2018 CLINICAL DATA:  Small bowel obstruction EXAM: PORTABLE ABDOMEN - 1 VIEW COMPARISON:  Yesterday FINDINGS: Gastric suction tube tip at the proximal stomach. Dilated small bowel measuring up to 3.7 cm, similar to prior. Less numerous loops of dilated bowel are seen. Oral contrast is still seen within nondilated colon. Aortic iliac stent grafting. No concerning mass effect or gas collection. IMPRESSION: Stable to mildly  improved partial small bowel obstruction. Electronically Signed   By: Monte Fantasia M.D.   On: 08/12/2018 07:26   Dg Abd Portable 1v  Result Date: 08/11/2018 CLINICAL DATA:  Small bowel obstruction. EXAM: PORTABLE ABDOMEN - 1 VIEW COMPARISON:  Radiograph of August 10, 2018. FINDINGS: Distal tip of nasogastric tube is stable in proximal stomach. Status post stent graft repair of abdominal aortic aneurysm. Persistent small bowel dilatation is noted concerning for distal small bowel obstruction. Residual contrast remains within the colon which is nondilated. IMPRESSION:  Stable small bowel dilatation is noted concerning for distal small bowel obstruction. Electronically Signed   By: Marijo Conception M.D.   On: 08/11/2018 12:49    Anti-infectives: Anti-infectives (From admission, onward)   None       Assessment/Plan GERD HTN Tobacco abuse Hx of aortic aneurysm status post repair 4 years ago(Endovascular)  SBO - Recent admission to [facility] on 6/18 for SBO that resolved with conservative management -patient's films today still show a high grade PSBO with some contrast in her colon but her small bowel is still quite dilated.  She still has her NGT in place but clamped and ate CLD this am.  She still seems somewhat bloated.  I am not convinced based off of her films that her obstruction is truly resolving; however, clinically she is eating some clear liquids currently and has tolerated them so far.  It is possible she may tolerate liquids but then not tolerate more solid food.  For now I will continue her on a full day of clear liquids with her NGT clamped and re-eval in am with a new x-ray.  We discussed for a very long time her situation and how this is not clear cut as she is not completely obstructed but has not completely resolved either.  We discussed that she may still need an operation, but she could potentially still get better.  We discussed for a long time possible etiologies etc.  She then stated at one point about wanting to leave.  We discussed that would have to be AMA as she is not surgically cleared to be discharged.  We discussed risks of leaving AMA such as aspiration, perforation, or ischemia, etc that could happen if she leaves before she is improved.   -for now CLD and NGT clamped.  Repeat films in the am -will further discuss the possible need for surgery with MD.  Discussed patient with Dr. Reesa Chew as well.  ID -None VTE -SCDs,okay for chemical prophylaxis from a surgical standpoint FEN -CLD, NGT clamped, replace K today   LOS: 4 days     Henreitta Cea , [facility] 08/13/2018, 10:03 AM Pager: [phone]

## 2018-08-14 ENCOUNTER — Inpatient Hospital Stay (HOSPITAL_COMMUNITY): Payer: Medicare HMO

## 2018-08-14 LAB — CBC
HCT: 38.5 % (ref 36.0–46.0)
Hemoglobin: 12.7 g/dL (ref 12.0–15.0)
MCH: 30 pg (ref 26.0–34.0)
MCHC: 33 g/dL (ref 30.0–36.0)
MCV: 91 fL (ref 80.0–100.0)
Platelets: 369 10*3/uL (ref 150–400)
RBC: 4.23 MIL/uL (ref 3.87–5.11)
RDW: 13.6 % (ref 11.5–15.5)
WBC: 11.3 10*3/uL — ABNORMAL HIGH (ref 4.0–10.5)
nRBC: 0 % (ref 0.0–0.2)

## 2018-08-14 LAB — BASIC METABOLIC PANEL
Anion gap: 13 (ref 5–15)
BUN: 11 mg/dL (ref 8–23)
CO2: 26 mmol/L (ref 22–32)
Calcium: 8.8 mg/dL — ABNORMAL LOW (ref 8.9–10.3)
Chloride: 96 mmol/L — ABNORMAL LOW (ref 98–111)
Creatinine, Ser: 0.51 mg/dL (ref 0.44–1.00)
GFR calc Af Amer: >60 mL/min (ref >60)
GFR calc non Af Amer: >60 mL/min (ref >60)
Glucose, Bld: 99 mg/dL (ref 70–99)
Potassium: 3 mmol/L — ABNORMAL LOW (ref 3.5–5.1)
Sodium: 135 mmol/L (ref 135–145)

## 2018-08-14 LAB — GLUCOSE, CAPILLARY
Glucose-Capillary: 92 mg/dL (ref 70–99)
Glucose-Capillary: 96 mg/dL (ref 70–99)
Glucose-Capillary: 86 mg/dL (ref 70–99)
Glucose-Capillary: 89 mg/dL (ref 70–99)

## 2018-08-14 LAB — MAGNESIUM: Magnesium: 1.8 mg/dL (ref 1.7–2.4)

## 2018-08-14 MED ORDER — ALPRAZOLAM 0.25 MG PO TABS
0.2500 mg | ORAL_TABLET | Freq: Every evening | ORAL | Status: DC | PRN
  Administered 2018-08-14 – 2018-08-15 (×2): 0.25 mg via ORAL
  Filled 2018-08-14 (×2): qty 1

## 2018-08-14 MED ORDER — MAGNESIUM OXIDE 400 (241.3 MG) MG PO TABS: 800.0000 mg | ORAL_TABLET | ORAL | Status: DC

## 2018-08-14 MED ORDER — POTASSIUM CHLORIDE 10 MEQ/100ML IV SOLN
INTRAVENOUS | Status: AC
  Filled 2018-08-14: qty 100

## 2018-08-14 MED ORDER — MAGNESIUM SULFATE 2 GM/50ML IV SOLN
2.0000 g | Freq: Once | INTRAVENOUS | Status: AC
  Administered 2018-08-14: 2 g via INTRAVENOUS

## 2018-08-14 MED ORDER — POTASSIUM CHLORIDE CRYS ER 20 MEQ PO TBCR: 40.0000 meq | EXTENDED_RELEASE_TABLET | Freq: Three times a day (TID) | ORAL | Status: DC

## 2018-08-14 MED ORDER — POTASSIUM CHLORIDE 10 MEQ/100ML IV SOLN
10.0000 meq | INTRAVENOUS | Status: AC
  Administered 2018-08-14 (×6): 10 meq via INTRAVENOUS
  Filled 2018-08-14 (×5): qty 100

## 2018-08-14 MED ORDER — ENOXAPARIN SODIUM 40 MG/0.4ML ~~LOC~~ SOLN
40.0000 mg | SUBCUTANEOUS | Status: DC
  Administered 2018-08-14 – 2018-08-15 (×2): 40 mg via SUBCUTANEOUS
  Filled 2018-08-14 (×2): qty 0.4

## 2018-08-14 NOTE — Progress Notes (Signed)
Pt complaining of distention and bloating all throughout my shift. Pt abdomen was taut and firm. NG was left attached to LIWS all night due to this per order from MD.

## 2018-08-14 NOTE — Progress Notes (Signed)
PROGRESS NOTE    Gloria Morrison  AUQ:[id number] DOB: 27-Dec-1950 DOA: 08/09/2018 PCP: Berkley Harvey, NP   Brief Narrative:  68 year old with history of GERD, tobacco use, essential hypertension, abdominal aortic aneurysm repair about 4 years ago came to the hospital with abdominal pain.  Patient was admitted to the hospital for recurrent distal small bowel obstruction.  She was just at [facility] with the same issue treated conservatively.  Surgery team has been consulted. SBO managed conservatively.    Assessment & Plan:   Principal Problem:   SBO (small bowel obstruction) (HCC) Active Problems:   AAA (abdominal aortic aneurysm) without rupture (HCC)   HTN (hypertension)   Hypokalemia  Distal small bowel obstruction with transition point in right lower quadrant;  Abdominal pain with nausea -Distended and bloated overnight therefore NG placed to suction.  Conservative management at this time. -Defer surgical plan to general surgery team. -Aggressively replete electrolytes  Hypokalemia - Replete aggressively  Essential hypertension - Amlodipine is currently on hold.  GERD -PPI been able to take oral.  History of abdominal aortic aneurysm without rupture -Status post endovascular repair.  Patient is at high risk of bleeding AGAINST MEDICAL ADVICE.  VT prophylaxis: SCDs, changed to Lovenox Code Status: Full code Family Communication: None at bedside Disposition Plan: Maintain hospital stay until her bowel function returns to normal  Consultants:   General surgery  Procedures:  None Antimicrobials:   None   Subjective: Patient again states she is passing gas and had a bowel movement this morning.  But then later on again goes oncology that she still feels little bloated.  Also at first she tells me she wants to eat and drink and then later tells me does not want to eat and drink as she is afraid of surgery.  Review of Systems Otherwise  negative except as per HPI, including: General = no fevers, chills, dizziness, malaise, fatigue HEENT/EYES = negative for pain, redness, loss of vision, double vision, blurred vision, loss of hearing, sore throat, hoarseness, dysphagia Cardiovascular= negative for chest pain, palpitation, murmurs, lower extremity swelling Respiratory/lungs= negative for shortness of breath, cough, hemoptysis, wheezing, mucus production Gastrointestinal= negative for nausea, vomiting,, abdominal pain, melena, hematemesis Genitourinary= negative for Dysuria, Hematuria, Change in Urinary Frequency MSK = Negative for arthralgia, myalgias, Back Pain, Joint swelling  Neurology= Negative for headache, seizures, numbness, tingling  Psychiatry= Negative for anxiety, depression, suicidal and homocidal ideation Allergy/Immunology= Medication/Food allergy as listed  Skin= Negative for Rash, lesions, ulcers, itching    Objective: Vitals:   08/13/18 0650 08/13/18 2031 08/14/18 0432 08/14/18 0950  BP: (!) 163/95 (!) 148/87 (!) 161/97 130/76  Pulse: 68 62 78   Resp: 20 18 16    Temp: 98.4 F (36.9 C) 98 F (36.7 C) 99.9 F (37.7 C)   TempSrc: Oral Oral Oral   SpO2: 94% 97% 93%   Weight: 69.9 kg  67.6 kg   Height:        Intake/Output Summary (Last 24 hours) at 08/14/2018 1104 Last data filed at 08/14/2018 0900 Gross per 24 hour  Intake 449.68 ml  Output 1750 ml  Net -1300.32 ml   Filed Weights   08/12/18 0623 08/13/18 0650 08/14/18 0432  Weight: 66.6 kg 69.9 kg 67.6 kg    Examination: Constitutional: NAD, calm, comfortable, NG tube to suction Eyes: PERRL, lids and conjunctivae normal ENMT: Mucous membranes are moist. Posterior pharynx clear of any exudate or lesions.Normal dentition.  Neck: normal, supple, no masses, no  thyromegaly Respiratory: clear to auscultation bilaterally, no wheezing, no crackles. Normal respiratory effort. No accessory muscle use.  Cardiovascular: Regular rate and rhythm, no  murmurs / rubs / gallops. No extremity edema. 2+ pedal pulses. No carotid bruits.  Abdomen: no tenderness, no masses palpated. No hepatosplenomegaly. Bowel sounds positive.  Musculoskeletal: no clubbing / cyanosis. No joint deformity upper and lower extremities. Good ROM, no contractures. Normal muscle tone.  Skin: no rashes, lesions, ulcers. No induration Neurologic: CN 2-12 grossly intact. Sensation intact, DTR normal. Strength 5/5 in all 4.  Psychiatric: Normal judgment and insight. Alert and oriented x 3. Normal mood.   Data Reviewed:   CBC: Recent Labs  Lab 08/10/18 0630 08/11/18 0508 08/12/18 0517 08/13/18 0453 08/14/18 0508  WBC 9.4 9.7 10.2 10.9* 11.3*  NEUTROABS 6.4  --   --   --   --   HGB 12.9 13.3 12.0 12.0 12.7  HCT 38.0 40.6 37.8 36.8 38.5  MCV 90.7 92.9 93.8 92.7 91.0  PLT 299 332 329 319 867   Basic Metabolic Panel: Recent Labs  Lab 08/11/18 0508 08/12/18 0517 08/12/18 1031 08/13/18 0453 08/14/18 0508  NA 141 143 143 136 135  K 3.1* 3.3* 3.4* 3.0* 3.0*  CL 107 109 113* 103 96*  CO2 22 26 25 24 26   GLUCOSE 113* 109* 104* 104* 99  BUN 12 16 16 10 11   CREATININE 0.52 0.51 0.50 0.47 0.51  CALCIUM 8.6* 8.6* 8.8* 8.6* 8.8*  MG 1.9 2.1  --  1.7 1.8   GFR: Estimated Creatinine Clearance: 64.8 mL/min (by C-G formula based on SCr of 0.51 mg/dL). Liver Function Tests: Recent Labs  Lab 08/09/18 1859 08/10/18 0630  AST 22 21  ALT 17 16  ALKPHOS 75 68  BILITOT 0.8 0.6  PROT 7.1 6.1*  ALBUMIN 3.8 3.4*   Recent Labs  Lab 08/09/18 1859  LIPASE 26   No results for input(s): AMMONIA in the last 168 hours. Coagulation Profile: No results for input(s): INR, PROTIME in the last 168 hours. Cardiac Enzymes: No results for input(s): CKTOTAL, CKMB, CKMBINDEX, TROPONINI in the last 168 hours. BNP (last 3 results) No results for input(s): PROBNP in the last 8760 hours. HbA1C: Recent Labs    08/12/18 0517  HGBA1C 5.6   CBG: Recent Labs  Lab 08/13/18  0748 08/13/18 1121 08/13/18 1651 08/13/18 2140 08/14/18 0812  GLUCAP 95 94 92 86 92   Lipid Profile: No results for input(s): CHOL, HDL, LDLCALC, TRIG, CHOLHDL, LDLDIRECT in the last 72 hours. Thyroid Function Tests: No results for input(s): TSH, T4TOTAL, FREET4, T3FREE, THYROIDAB in the last 72 hours. Anemia Panel: No results for input(s): VITAMINB12, FOLATE, FERRITIN, TIBC, IRON, RETICCTPCT in the last 72 hours. Sepsis Labs: No results for input(s): PROCALCITON, LATICACIDVEN in the last 168 hours.  Recent Results (from the past 240 hour(s))  SARS Coronavirus 2 (CEPHEID - Performed in [place] hospital lab), Hosp Order     Status: None   Collection Time: 08/10/18  1:53 AM   Specimen: Nasopharyngeal Swab  Result Value Ref Range Status   SARS Coronavirus 2 NEGATIVE NEGATIVE Final    Comment: (NOTE) If result is NEGATIVE SARS-CoV-2 target nucleic acids are NOT DETECTED. The SARS-CoV-2 RNA is generally detectable in upper and lower  respiratory specimens during the acute phase of infection. The lowest  concentration of SARS-CoV-2 viral copies this assay can detect is 250  copies / mL. A negative result does not preclude SARS-CoV-2 infection  and should not  be used as the sole basis for treatment or other  patient management decisions.  A negative result may occur with  improper specimen collection / handling, submission of specimen other  than nasopharyngeal swab, presence of viral mutation(s) within the  areas targeted by this assay, and inadequate number of viral copies  (<250 copies / mL). A negative result must be combined with clinical  observations, patient history, and epidemiological information. If result is POSITIVE SARS-CoV-2 target nucleic acids are DETECTED. The SARS-CoV-2 RNA is generally detectable in upper and lower  respiratory specimens dur ing the acute phase of infection.  Positive  results are indicative of active infection with SARS-CoV-2.  Clinical   correlation with patient history and other diagnostic information is  necessary to determine patient infection status.  Positive results do  not rule out bacterial infection or co-infection with other viruses. If result is PRESUMPTIVE POSTIVE SARS-CoV-2 nucleic acids MAY BE PRESENT.   A presumptive positive result was obtained on the submitted specimen  and confirmed on repeat testing.  While 2019 novel coronavirus  (SARS-CoV-2) nucleic acids may be present in the submitted sample  additional confirmatory testing may be necessary for epidemiological  and / or clinical management purposes  to differentiate between  SARS-CoV-2 and other Sarbecovirus currently known to infect humans.  If clinically indicated additional testing with an alternate test  methodology [phone]) is advised. The SARS-CoV-2 RNA is generally  detectable in upper and lower respiratory sp ecimens during the acute  phase of infection. The expected result is Negative. Fact Sheet for Patients:  StrictlyIdeas.no Fact Sheet for Healthcare Providers: BankingDealers.co.za This test is not yet approved or cleared by the [place] FDA and has been authorized for detection and/or diagnosis of SARS-CoV-2 by FDA under an Emergency Use Authorization (EUA).  This EUA will remain in effect (meaning this test can be used) for the duration of the COVID-19 declaration under Section 564(b)(1) of the Act, 21 U.S.C. section 360bbb-3(b)(1), unless the authorization is terminated or revoked sooner. Performed at [facility], [place] [address], [place], [place] [address]          Radiology Studies: Dg Abd Portable 1v  Result Date: 08/14/2018 CLINICAL DATA:  Partial small bowel obstruction. EXAM: PORTABLE ABDOMEN - 1 VIEW COMPARISON:  Plain films of the abdomen dated 08/13/2018 and 08/12/2018. FINDINGS: Again noted are distended gas-filled loops of small bowel  throughout the abdomen and upper pelvis, compatible with the description of small bowel obstruction. No significant change in the degree of bowel distension. Enteric tube appears adequately positioned in the stomach. Aorta bi-iliac stent in place. No acute or suspicious osseous finding. IMPRESSION: Continued evidence of small bowel obstruction. No significant change compared to yesterday's exam. Electronically Signed   By: Franki Cabot M.D.   On: 08/14/2018 07:38   Dg Abd Portable 1v  Result Date: 08/13/2018 CLINICAL DATA:  Small-bowel obstruction EXAM: PORTABLE ABDOMEN - 1 VIEW COMPARISON:  Portable exam 0457 hours compared to 08/12/2018 FINDINGS: Aorto bi-iliac stent. Nasogastric tube projects over proximal stomach. Scattered retained contrast in colon. Persistent dilatation of numerous small bowel loops consistent with small bowel obstruction. No definite bowel wall thickening. Bones demineralized. IMPRESSION: Persistent small bowel dilatation consistent with small bowel obstruction, not significantly changed. Electronically Signed   By: Lavonia Dana M.D.   On: 08/13/2018 08:03      Scheduled Meds: . diphenhydrAMINE  12.5 mg Intravenous Once  . insulin aspart  0-5 Units Subcutaneous QHS  . insulin  aspart  0-9 Units Subcutaneous TID WC  . nicotine  7 mg Transdermal QHS   Continuous Infusions: . sodium chloride 10 mL (08/14/18 0951)  . potassium chloride 10 mEq (08/14/18 1059)     LOS: 5 days   Time spent= 30 mins    Kimberlye Dilger Arsenio Loader, MD [facility]  If 7PM-7AM, please contact night-coverage [email or web address] 08/14/2018, 11:04 AM

## 2018-08-14 NOTE — Progress Notes (Signed)
Patient ID: Gloria Morrison, female   DOB: May 18, 1950, 68 y.o.   MRN: [id number]       Subjective: No acute change.  Still has intermittent cramping abdominal pain and bloating with attempts at clears.  Frustrated at course of events.  Objective: Vital signs in last 24 hours: Temp:  [98 F (36.7 C)-99.9 F (37.7 C)] 99.9 F (37.7 C) (06/27 0432) Pulse Rate:  [62-78] 78 (06/27 0432) Resp:  [16-18] 16 (06/27 0432) BP: (148-161)/(87-97) 161/97 (06/27 0432) SpO2:  [93 %-97 %] 93 % (06/27 0432) Weight:  [67.6 kg] 67.6 kg (06/27 0432) Last BM Date: 08/13/18  Intake/Output from previous day: 06/26 0701 - 06/27 0700 In: 369.7 [P.O.:290; I.V.:79.7] Out: 1450 [Emesis/NG output:1450] Intake/Output this shift: Total I/O In: -  Out: 300 [Emesis/NG output:300]  PE: Gen: NAD Heart: regular Lungs: CTAB Abd: soft, minimally distended and mild tenderness in central part of abdomen.   NGT in place but clamped.  Lab Results:  Recent Labs    08/13/18 0453 08/14/18 0508  WBC 10.9* 11.3*  HGB 12.0 12.7  HCT 36.8 38.5  PLT 319 369   BMET Recent Labs    08/13/18 0453 08/14/18 0508  NA 136 135  K 3.0* 3.0*  CL 103 96*  CO2 24 26  GLUCOSE 104* 99  BUN 10 11  CREATININE 0.47 0.51  CALCIUM 8.6* 8.8*   PT/INR No results for input(s): LABPROT, INR in the last 72 hours. CMP     Component Value Date/Time   NA 135 08/14/2018 0508   K 3.0 (L) 08/14/2018 0508   CL 96 (L) 08/14/2018 0508   CO2 26 08/14/2018 0508   GLUCOSE 99 08/14/2018 0508   BUN 11 08/14/2018 0508   CREATININE 0.51 08/14/2018 0508   CREATININE 0.49 (L) 01/19/2015 1239   CALCIUM 8.8 (L) 08/14/2018 0508   PROT 6.1 (L) 08/10/2018 0630   ALBUMIN 3.4 (L) 08/10/2018 0630   AST 21 08/10/2018 0630   ALT 16 08/10/2018 0630   ALKPHOS 68 08/10/2018 0630   BILITOT 0.6 08/10/2018 0630   GFRNONAA >60 08/14/2018 0508   GFRAA >60 08/14/2018 0508   Lipase     Component Value Date/Time   LIPASE 26 08/09/2018 1859       Studies/Results: Dg Abd Portable 1v  Result Date: 08/14/2018 CLINICAL DATA:  Partial small bowel obstruction. EXAM: PORTABLE ABDOMEN - 1 VIEW COMPARISON:  Plain films of the abdomen dated 08/13/2018 and 08/12/2018. FINDINGS: Again noted are distended gas-filled loops of small bowel throughout the abdomen and upper pelvis, compatible with the description of small bowel obstruction. No significant change in the degree of bowel distension. Enteric tube appears adequately positioned in the stomach. Aorta bi-iliac stent in place. No acute or suspicious osseous finding. IMPRESSION: Continued evidence of small bowel obstruction. No significant change compared to yesterday's exam. Electronically Signed   By: Franki Cabot M.D.   On: 08/14/2018 07:38   Dg Abd Portable 1v  Result Date: 08/13/2018 CLINICAL DATA:  Small-bowel obstruction EXAM: PORTABLE ABDOMEN - 1 VIEW COMPARISON:  Portable exam 0457 hours compared to 08/12/2018 FINDINGS: Aorto bi-iliac stent. Nasogastric tube projects over proximal stomach. Scattered retained contrast in colon. Persistent dilatation of numerous small bowel loops consistent with small bowel obstruction. No definite bowel wall thickening. Bones demineralized. IMPRESSION: Persistent small bowel dilatation consistent with small bowel obstruction, not significantly changed. Electronically Signed   By: Lavonia Dana M.D.   On: 08/13/2018 08:03    Anti-infectives: Anti-infectives (From admission,  onward)   None       Assessment/Plan GERD HTN Tobacco abuse Hx of aortic aneurysm status post repair 4 years ago(Endovascular)  SBO - Recent admission to [facility] on 6/18 for SBO that resolved with conservative management, came here on 622 with CT demonstrating ongoing obstruction with transition in the right lower quadrant and edema in the mesentery.  Has not had any prior abdominal surgery. -While contrast from the small bowel obstruction protocol transitioned into the colon  readily, and she endorses flatus and bowel movements, she continues to be symptomatic with bloating and cramping pain after trying clears.  Plan for continued supportive care for now.  I had a very long conversation with her again today about possible need for laparotomy; if symptoms are deriving from ileus or inflammatory bowel disease, this is a big risk to undertake given her medical history without any certain benefit.   ID -None VTE -SCDs,okay for chemical prophylaxis from a surgical standpoint FEN -replace magnesium and potassium IV   LOS: 5 days    Clovis Riley , [facility] 08/14/2018, 9:19 AM  Please see AMION to contact on-call provider if needed

## 2018-08-14 NOTE — Progress Notes (Signed)
Patient requesting to go her car to pick up some valuables. MD made aware. Not medically safe for patient to go out of unit. Communicated same to patient. Patient requesting for security to to be contacted. Same done

## 2018-08-15 LAB — CBC
HCT: 37.6 % (ref 36.0–46.0)
Hemoglobin: 12.1 g/dL (ref 12.0–15.0)
MCH: 30 pg (ref 26.0–34.0)
MCHC: 32.2 g/dL (ref 30.0–36.0)
MCV: 93.3 fL (ref 80.0–100.0)
Platelets: 368 10*3/uL (ref 150–400)
RBC: 4.03 MIL/uL (ref 3.87–5.11)
RDW: 13.5 % (ref 11.5–15.5)
WBC: 9.4 10*3/uL (ref 4.0–10.5)
nRBC: 0 % (ref 0.0–0.2)

## 2018-08-15 LAB — BASIC METABOLIC PANEL
Anion gap: 8 (ref 5–15)
BUN: 9 mg/dL (ref 8–23)
CO2: 25 mmol/L (ref 22–32)
Calcium: 8.4 mg/dL — ABNORMAL LOW (ref 8.9–10.3)
Chloride: 102 mmol/L (ref 98–111)
Creatinine, Ser: 0.47 mg/dL (ref 0.44–1.00)
GFR calc Af Amer: >60 mL/min (ref >60)
GFR calc non Af Amer: >60 mL/min (ref >60)
Glucose, Bld: 86 mg/dL (ref 70–99)
Potassium: 3.3 mmol/L — ABNORMAL LOW (ref 3.5–5.1)
Sodium: 135 mmol/L (ref 135–145)

## 2018-08-15 LAB — GLUCOSE, CAPILLARY
Glucose-Capillary: 76 mg/dL (ref 70–99)
Glucose-Capillary: 77 mg/dL (ref 70–99)

## 2018-08-15 LAB — MAGNESIUM: Magnesium: 2.1 mg/dL (ref 1.7–2.4)

## 2018-08-15 MED ORDER — POTASSIUM CHLORIDE 10 MEQ/100ML IV SOLN
10.0000 meq | INTRAVENOUS | Status: AC
  Administered 2018-08-15 (×4): 10 meq via INTRAVENOUS
  Filled 2018-08-15 (×4): qty 100

## 2018-08-15 NOTE — Plan of Care (Signed)
Anxious for discharge in AM.  Progressing well.

## 2018-08-15 NOTE — Progress Notes (Signed)
   Subjective/Chief Complaint: Feels better. Passing flatus and having bm's. Ng clamped siince yesterday   Objective: Vital signs in last 24 hours: Temp:  [98.1 F (36.7 C)-98.4 F (36.9 C)] 98.1 F (36.7 C) (06/28 0601) Pulse Rate:  [61-62] 62 (06/28 0601) Resp:  [16-18] 17 (06/28 0601) BP: (130-154)/(76-99) 150/76 (06/28 0601) SpO2:  [94 %-97 %] 94 % (06/28 0601) Weight:  [65.5 kg] 65.5 kg (06/28 0605) Last BM Date: 08/14/18  Intake/Output from previous day: 06/27 0701 - 06/28 0700 In: 1154 [P.O.:480; I.V.:24.5; IV Piggyback:649.5] Out: 1070 [Emesis/NG output:1070] Intake/Output this shift: No intake/output data recorded.  General appearance: alert and cooperative Resp: clear to auscultation bilaterally Cardio: regular rate and rhythm GI: soft, nontender. not distended  Lab Results:  Recent Labs    08/14/18 0508 08/15/18 0514  WBC 11.3* 9.4  HGB 12.7 12.1  HCT 38.5 37.6  PLT 369 368   BMET Recent Labs    08/14/18 0508 08/15/18 0514  NA 135 135  K 3.0* 3.3*  CL 96* 102  CO2 26 25  GLUCOSE 99 86  BUN 11 9  CREATININE 0.51 0.47  CALCIUM 8.8* 8.4*   PT/INR No results for input(s): LABPROT, INR in the last 72 hours. ABG No results for input(s): PHART, HCO3 in the last 72 hours.  Invalid input(s): PCO2, PO2  Studies/Results: Dg Abd Portable 1v  Result Date: 08/14/2018 CLINICAL DATA:  Partial small bowel obstruction. EXAM: PORTABLE ABDOMEN - 1 VIEW COMPARISON:  Plain films of the abdomen dated 08/13/2018 and 08/12/2018. FINDINGS: Again noted are distended gas-filled loops of small bowel throughout the abdomen and upper pelvis, compatible with the description of small bowel obstruction. No significant change in the degree of bowel distension. Enteric tube appears adequately positioned in the stomach. Aorta bi-iliac stent in place. No acute or suspicious osseous finding. IMPRESSION: Continued evidence of small bowel obstruction. No significant change  compared to yesterday's exam. Electronically Signed   By: Franki Cabot M.D.   On: 08/14/2018 07:38    Anti-infectives: Anti-infectives (From admission, onward)   None      Assessment/Plan: s/p * No surgery found * Advance diet. Start fulls today D/c ng Ambulate Psbo seems to be resolving  LOS: 6 days    Autumn Messing III 08/15/2018

## 2018-08-15 NOTE — Progress Notes (Signed)
PROGRESS NOTE    Gloria Morrison  QQI:[id number] DOB: [birth year] DOA: 08/09/2018 PCP: Berkley Harvey, NP   Brief Narrative:  68 year old with history of GERD, tobacco use, essential hypertension, abdominal aortic aneurysm repair about 4 years ago came to the hospital with abdominal pain.  Patient was admitted to the hospital for recurrent distal small bowel obstruction.  She was just at [facility] with the same issue treated conservatively.  Surgery team has been consulted. SBO managed conservatively.    Assessment & Plan:   Principal Problem:   SBO (small bowel obstruction) (HCC) Active Problems:   AAA (abdominal aortic aneurysm) without rupture (HCC)   HTN (hypertension)   Hypokalemia  Distal small bowel obstruction with transition point in right lower quadrant;  Abdominal pain with nausea -Feeling better, plan to adv diet. Clamp NG tube. Encourage to ambulate her.  -Defer surgical plan to general surgery team. -Aggressively replete electrolytes  Hypokalemia - Replete aggressively  Essential hypertension - Amlodipine is currently on hold.  GERD -PPI been able to take oral.  History of abdominal aortic aneurysm without rupture -Status post endovascular repair.   VT prophylaxis: SCDs, changed to Lovenox Code Status: Full code Family Communication: None at bedside Disposition Plan: Maintain hospital stay until her bowel function returns to normal  Consultants:   General surgery  Procedures:  None Antimicrobials:   None   Subjective: + passing gas this morning. Last BM yesterday., Abd feels softer.   Review of Systems Otherwise negative except as per HPI, including: General = no fevers, chills, dizziness, malaise, fatigue HEENT/EYES = negative for pain, redness, loss of vision, double vision, blurred vision, loss of hearing, sore throat, hoarseness, dysphagia Cardiovascular= negative for chest pain, palpitation, murmurs, lower extremity  swelling Respiratory/lungs= negative for shortness of breath, cough, hemoptysis, wheezing, mucus production Gastrointestinal= negative for nausea, vomiting,, abdominal pain, melena, hematemesis Genitourinary= negative for Dysuria, Hematuria, Change in Urinary Frequency MSK = Negative for arthralgia, myalgias, Back Pain, Joint swelling  Neurology= Negative for headache, seizures, numbness, tingling  Psychiatry= Negative for anxiety, depression, suicidal and homocidal ideation Allergy/Immunology= Medication/Food allergy as listed  Skin= Negative for Rash, lesions, ulcers, itching    Objective: Vitals:   08/14/18 1313 08/14/18 2010 08/15/18 0601 08/15/18 0605  BP: (!) 140/99 (!) 154/88 (!) 150/76   Pulse: 61 61 62   Resp: 18 16 17    Temp: 98.4 F (36.9 C) 98.2 F (36.8 C) 98.1 F (36.7 C)   TempSrc: Oral Oral Oral   SpO2: 97% 94% 94%   Weight:    65.5 kg  Height:        Intake/Output Summary (Last 24 hours) at 08/15/2018 1126 Last data filed at 08/15/2018 0500 Gross per 24 hour  Intake 833.97 ml  Output 770 ml  Net 63.97 ml   Filed Weights   08/13/18 0650 08/14/18 0432 08/15/18 0605  Weight: 69.9 kg 67.6 kg 65.5 kg    Examination: Constitutional: NAD, calm, comfortable Eyes: PERRL, lids and conjunctivae normal ENMT: Mucous membranes are moist. Posterior pharynx clear of any exudate or lesions.Normal dentition.  Neck: normal, supple, no masses, no thyromegaly Respiratory: clear to auscultation bilaterally, no wheezing, no crackles. Normal respiratory effort. No accessory muscle use.  Cardiovascular: Regular rate and rhythm, no murmurs / rubs / gallops. No extremity edema. 2+ pedal pulses. No carotid bruits.  Abdomen: no tenderness, no masses palpated. No hepatosplenomegaly. Bowel sounds positive.  Musculoskeletal: no clubbing / cyanosis. No joint deformity upper and lower extremities.  Good ROM, no contractures. Normal muscle tone.  Skin: no rashes, lesions, ulcers. No  induration Neurologic: CN 2-12 grossly intact. Sensation intact, DTR normal. Strength 5/5 in all 4.  Psychiatric: Normal judgment and insight. Alert and oriented x 3. Normal mood.    Data Reviewed:   CBC: Recent Labs  Lab 08/10/18 0630 08/11/18 0508 08/12/18 0517 08/13/18 0453 08/14/18 0508 08/15/18 0514  WBC 9.4 9.7 10.2 10.9* 11.3* 9.4  NEUTROABS 6.4  --   --   --   --   --   HGB 12.9 13.3 12.0 12.0 12.7 12.1  HCT 38.0 40.6 37.8 36.8 38.5 37.6  MCV 90.7 92.9 93.8 92.7 91.0 93.3  PLT 299 332 329 319 369 220   Basic Metabolic Panel: Recent Labs  Lab 08/11/18 0508 08/12/18 0517 08/12/18 1031 08/13/18 0453 08/14/18 0508 08/15/18 0514  NA 141 143 143 136 135 135  K 3.1* 3.3* 3.4* 3.0* 3.0* 3.3*  CL 107 109 113* 103 96* 102  CO2 22 26 25 24 26 25   GLUCOSE 113* 109* 104* 104* 99 86  BUN 12 16 16 10 11 9   CREATININE 0.52 0.51 0.50 0.47 0.51 0.47  CALCIUM 8.6* 8.6* 8.8* 8.6* 8.8* 8.4*  MG 1.9 2.1  --  1.7 1.8 2.1   GFR: Estimated Creatinine Clearance: 64.8 mL/min (by C-G formula based on SCr of 0.47 mg/dL). Liver Function Tests: Recent Labs  Lab 08/09/18 1859 08/10/18 0630  AST 22 21  ALT 17 16  ALKPHOS 75 68  BILITOT 0.8 0.6  PROT 7.1 6.1*  ALBUMIN 3.8 3.4*   Recent Labs  Lab 08/09/18 1859  LIPASE 26   No results for input(s): AMMONIA in the last 168 hours. Coagulation Profile: No results for input(s): INR, PROTIME in the last 168 hours. Cardiac Enzymes: No results for input(s): CKTOTAL, CKMB, CKMBINDEX, TROPONINI in the last 168 hours. BNP (last 3 results) No results for input(s): PROBNP in the last 8760 hours. HbA1C: No results for input(s): HGBA1C in the last 72 hours. CBG: Recent Labs  Lab 08/14/18 1121 08/14/18 1601 08/14/18 2012 08/15/18 0726 08/15/18 1111  GLUCAP 96 86 89 76 77   Lipid Profile: No results for input(s): CHOL, HDL, LDLCALC, TRIG, CHOLHDL, LDLDIRECT in the last 72 hours. Thyroid Function Tests: No results for input(s):  TSH, T4TOTAL, FREET4, T3FREE, THYROIDAB in the last 72 hours. Anemia Panel: No results for input(s): VITAMINB12, FOLATE, FERRITIN, TIBC, IRON, RETICCTPCT in the last 72 hours. Sepsis Labs: No results for input(s): PROCALCITON, LATICACIDVEN in the last 168 hours.  Recent Results (from the past 240 hour(s))  SARS Coronavirus 2 (CEPHEID - Performed in [place] hospital lab), Hosp Order     Status: None   Collection Time: 08/10/18  1:53 AM   Specimen: Nasopharyngeal Swab  Result Value Ref Range Status   SARS Coronavirus 2 NEGATIVE NEGATIVE Final    Comment: (NOTE) If result is NEGATIVE SARS-CoV-2 target nucleic acids are NOT DETECTED. The SARS-CoV-2 RNA is generally detectable in upper and lower  respiratory specimens during the acute phase of infection. The lowest  concentration of SARS-CoV-2 viral copies this assay can detect is 250  copies / mL. A negative result does not preclude SARS-CoV-2 infection  and should not be used as the sole basis for treatment or other  patient management decisions.  A negative result may occur with  improper specimen collection / handling, submission of specimen other  than nasopharyngeal swab, presence of viral mutation(s) within the  areas  targeted by this assay, and inadequate number of viral copies  (<250 copies / mL). A negative result must be combined with clinical  observations, patient history, and epidemiological information. If result is POSITIVE SARS-CoV-2 target nucleic acids are DETECTED. The SARS-CoV-2 RNA is generally detectable in upper and lower  respiratory specimens dur ing the acute phase of infection.  Positive  results are indicative of active infection with SARS-CoV-2.  Clinical  correlation with patient history and other diagnostic information is  necessary to determine patient infection status.  Positive results do  not rule out bacterial infection or co-infection with other viruses. If result is PRESUMPTIVE POSTIVE  SARS-CoV-2 nucleic acids MAY BE PRESENT.   A presumptive positive result was obtained on the submitted specimen  and confirmed on repeat testing.  While 2019 novel coronavirus  (SARS-CoV-2) nucleic acids may be present in the submitted sample  additional confirmatory testing may be necessary for epidemiological  and / or clinical management purposes  to differentiate between  SARS-CoV-2 and other Sarbecovirus currently known to infect humans.  If clinically indicated additional testing with an alternate test  methodology [id number]) is advised. The SARS-CoV-2 RNA is generally  detectable in upper and lower respiratory sp ecimens during the acute  phase of infection. The expected result is Negative. Fact Sheet for Patients:  StrictlyIdeas.no Fact Sheet for Healthcare Providers: BankingDealers.co.za This test is not yet approved or cleared by the [place] FDA and has been authorized for detection and/or diagnosis of SARS-CoV-2 by FDA under an Emergency Use Authorization (EUA).  This EUA will remain in effect (meaning this test can be used) for the duration of the COVID-19 declaration under Section 564(b)(1) of the Act, 21 U.S.C. section 360bbb-3(b)(1), unless the authorization is terminated or revoked sooner. Performed at [facility], [place] [address], [place], [place] [address]          Radiology Studies: Dg Abd Portable 1v  Result Date: 08/14/2018 CLINICAL DATA:  Partial small bowel obstruction. EXAM: PORTABLE ABDOMEN - 1 VIEW COMPARISON:  Plain films of the abdomen dated 08/13/2018 and 08/12/2018. FINDINGS: Again noted are distended gas-filled loops of small bowel throughout the abdomen and upper pelvis, compatible with the description of small bowel obstruction. No significant change in the degree of bowel distension. Enteric tube appears adequately positioned in the stomach. Aorta bi-iliac stent in place. No  acute or suspicious osseous finding. IMPRESSION: Continued evidence of small bowel obstruction. No significant change compared to yesterday's exam. Electronically Signed   By: Franki Cabot M.D.   On: 08/14/2018 07:38      Scheduled Meds: . diphenhydrAMINE  12.5 mg Intravenous Once  . enoxaparin (LOVENOX) injection  40 mg Subcutaneous Q24H  . insulin aspart  0-5 Units Subcutaneous QHS  . insulin aspart  0-9 Units Subcutaneous TID WC  . nicotine  7 mg Transdermal QHS   Continuous Infusions: . sodium chloride 1,000 mL (08/15/18 0936)  . potassium chloride 10 mEq (08/15/18 0948)     LOS: 6 days   Time spent= 30 mins    Dorris Pierre Arsenio Loader, MD [facility]  If 7PM-7AM, please contact night-coverage [email or web address] 08/15/2018, 11:26 AM

## 2018-08-16 LAB — BASIC METABOLIC PANEL
Anion gap: 11 (ref 5–15)
BUN: 7 mg/dL — ABNORMAL LOW (ref 8–23)
CO2: 25 mmol/L (ref 22–32)
Calcium: 8.8 mg/dL — ABNORMAL LOW (ref 8.9–10.3)
Chloride: 99 mmol/L (ref 98–111)
Creatinine, Ser: 0.54 mg/dL (ref 0.44–1.00)
GFR calc Af Amer: >60 mL/min (ref >60)
GFR calc non Af Amer: >60 mL/min (ref >60)
Glucose, Bld: 98 mg/dL (ref 70–99)
Potassium: 3.3 mmol/L — ABNORMAL LOW (ref 3.5–5.1)
Sodium: 135 mmol/L (ref 135–145)

## 2018-08-16 LAB — CBC
HCT: 40.4 % (ref 36.0–46.0)
Hemoglobin: 12.9 g/dL (ref 12.0–15.0)
MCH: 29.6 pg (ref 26.0–34.0)
MCHC: 31.9 g/dL (ref 30.0–36.0)
MCV: 92.7 fL (ref 80.0–100.0)
Platelets: 418 10*3/uL — ABNORMAL HIGH (ref 150–400)
RBC: 4.36 MIL/uL (ref 3.87–5.11)
RDW: 13.4 % (ref 11.5–15.5)
WBC: 9.9 10*3/uL (ref 4.0–10.5)
nRBC: 0 % (ref 0.0–0.2)

## 2018-08-16 LAB — MAGNESIUM: Magnesium: 1.9 mg/dL (ref 1.7–2.4)

## 2018-08-16 MED ORDER — POTASSIUM CHLORIDE CRYS ER 20 MEQ PO TBCR: 40.0000 meq | EXTENDED_RELEASE_TABLET | Freq: Every day | ORAL | 0 refills | Status: AC

## 2018-08-16 MED ORDER — POTASSIUM CHLORIDE CRYS ER 20 MEQ PO TBCR: 40.0000 meq | EXTENDED_RELEASE_TABLET | Freq: Once | ORAL | Status: DC

## 2018-08-16 NOTE — Progress Notes (Signed)
    CC: SBO  Subjective: Patient is tolerating a soft diet and having bowel movements.  Still has some gas but passing flatus well.  Objective: Vital signs in last 24 hours: Temp:  [98.1 F (36.7 C)-98.7 F (37.1 C)] 98.7 F (37.1 C) (06/29 0446) Pulse Rate:  [51-70] 64 (06/29 0446) Resp:  [15-21] 21 (06/29 0446) BP: (141-171)/(76-98) 168/98 (06/29 0446) SpO2:  [95 %-97 %] 96 % (06/29 0446) Weight:  [69.5 kg] 69.5 kg (06/29 0500) Last BM Date: 08/15/18  120 p.o. recorded 1900 IV Voided x1 recorded 150 from the NG   Intake/Output from previous day: 06/28 0701 - 06/29 0700 In: 2029.1 [P.O.:120; I.V.:1509.1; IV Piggyback:400] Out: 151 [Urine:1; Emesis/NG output:150] Intake/Output this shift: No intake/output data recorded.  General appearance: alert, cooperative and no distress GI: soft, non-tender; bowel sounds normal; no masses,  no organomegaly  Lab Results:  Recent Labs    08/15/18 0514 08/16/18 0531  WBC 9.4 9.9  HGB 12.1 12.9  HCT 37.6 40.4  PLT 368 418*    BMET Recent Labs    08/15/18 0514 08/16/18 0531  NA 135 135  K 3.3* 3.3*  CL 102 99  CO2 25 25  GLUCOSE 86 98  BUN 9 7*  CREATININE 0.47 0.54  CALCIUM 8.4* 8.8*   PT/INR No results for input(s): LABPROT, INR in the last 72 hours.  Recent Labs  Lab 08/09/18 1859 08/10/18 0630  AST 22 21  ALT 17 16  ALKPHOS 75 68  BILITOT 0.8 0.6  PROT 7.1 6.1*  ALBUMIN 3.8 3.4*     Lipase     Component Value Date/Time   LIPASE 26 08/09/2018 1859   . sodium chloride 1,000 mL (08/15/18 0936)     Medications: . diphenhydrAMINE  12.5 mg Intravenous Once  . enoxaparin (LOVENOX) injection  40 mg Subcutaneous Q24H  . insulin aspart  0-5 Units Subcutaneous QHS  . insulin aspart  0-9 Units Subcutaneous TID WC  . nicotine  7 mg Transdermal QHS  . potassium chloride  40 mEq Oral Once    Assessment/Plan GERD Tobacco use Hypertension Abdominal aortic aneurysm repair - endovascular stent graft  07/19/2014 Hx SBO Hypokalemia   Partial small bowel obstruction  FEN: IV fluids/full liquids ID: None DVT: Lovenox POC: Tate,Christopher Spouse   [phone]  Follow up:  TBD   Plan: I am going to give her a soft diet instruction sheet.  She normally does not have issues with constipation.  She can go home from our standpoint.  No follow-up with our service is necessary.  LOS: 7 days    Gloria Morrison 08/16/2018 [id number]

## 2018-08-16 NOTE — Discharge Summary (Signed)
Physician Discharge Summary  Gloria Morrison RKY:[id number] DOB: Aug 23, 1950 DOA: 08/09/2018  PCP: Berkley Harvey, NP  Admit date: 08/09/2018 Discharge date: 08/16/2018  Admitted From: Home Disposition: Home  Recommendations for Outpatient Follow-up:  1. Follow up with PCP in 1-2 weeks 2. Please obtain BMP/CBC in one week your next doctors visit.  Take oral potassium tablets for 3 days Recommend soft diet for now   Discharge Condition: Stable CODE STATUS: Full code Diet recommendation: Soft diet, 2 g salt.  Brief/Interim Summary: 68 year old with history of GERD, tobacco use, essential hypertension, abdominal aortic aneurysm repair about 4 years ago came to the hospital with abdominal pain.  Patient was admitted to the hospital for recurrent distal small bowel obstruction.  She was just at [facility] with the same issue treated conservatively.  Surgery team has been consulted. SBO managed conservatively.  She was very slow to improve.  On the day of discharge she was passing gas and had multiple bowel movements.  She was tolerating oral diet without any nausea and vomiting.  Cleared by general surgery to be discharged.   Discharge Diagnoses:  Principal Problem:   SBO (small bowel obstruction) (HCC) Active Problems:   AAA (abdominal aortic aneurysm) without rupture (HCC)   HTN (hypertension)   Hypokalemia  Distal small bowel obstruction with transition point in right lower quadrant; , Resolved  Abdominal pain with nausea - Managed conservatively during the hospitalization.  She has done well.  Tolerating oral diet this morning.  NG tube has been removed on 6/28 per surgery.  She is passing gas and had multiple bowel movements.  Cleared by surgery for discharge  Hypokalemia - Replete aggressively.  Advised to take home supplements for 3 more days and get outpatient lab work in about 1 week to ensure this is remained stable.  Essential hypertension -  Resume home  meds  GERD -PPI been able to take oral.  History of abdominal aortic aneurysm without rupture -Status post endovascular repair.  Consultations:  General surgery  Subjective: Feels better, wishes to go home.  Had 2 bowel movements overnight and able to pass gas.  Tolerating oral diet.  Denies any nausea vomiting or other complaints.  Discharge Exam: Vitals:   08/15/18 2039 08/16/18 0446  BP: (!) 171/83 (!) 168/98  Pulse: (!) 51 64  Resp: 17 (!) 21  Temp: 98.1 F (36.7 C) 98.7 F (37.1 C)  SpO2: 97% 96%   Vitals:   08/15/18 1309 08/15/18 2039 08/16/18 0446 08/16/18 0500  BP: (!) 141/76 (!) 171/83 (!) 168/98   Pulse: 70 (!) 51 64   Resp: 15 17 (!) 21   Temp: 98.3 F (36.8 C) 98.1 F (36.7 C) 98.7 F (37.1 C)   TempSrc: Oral Oral Oral   SpO2: 95% 97% 96%   Weight:    69.5 kg  Height:    5\' 6"  (1.676 m)    General: Pt is alert, awake, not in acute distress Cardiovascular: RRR, S1/S2 +, no rubs, no gallops Respiratory: CTA bilaterally, no wheezing, no rhonchi Abdominal: Soft, NT, ND, bowel sounds + Extremities: no edema, no cyanosis  Discharge Instructions  Discharge Instructions    Call MD for:  persistant nausea and vomiting   Complete by: As directed    Diet - low sodium heart healthy   Complete by: As directed    Soft diet   Increase activity slowly   Complete by: As directed      Allergies as of 08/16/2018  Reactions   Tramadol Diarrhea   Other reaction(s): GI Upset (intolerance)      Medication List    TAKE these medications   amLODipine 5 MG tablet Commonly known as: NORVASC Take 1 tablet by mouth daily.   aspirin EC 81 MG tablet Take 81 mg by mouth daily.   calcium carbonate 1250 MG capsule Take 1,250 mg by mouth.   Co Q 10 10 MG Caps Take 100 mg by mouth.   estradiol 0.1 MG/GM vaginal cream Commonly known as: ESTRACE Place 1 Applicatorful vaginally once a week.   LORazepam 1 MG tablet Commonly known as: ATIVAN Take 1 mg by  mouth as needed for anxiety.   One Daily Plus Minerals Tabs Take 1 tablet by mouth.   pantoprazole 40 MG tablet Commonly known as: PROTONIX Take 40 mg by mouth daily.   potassium chloride SA 20 MEQ tablet Commonly known as: K-DUR Take 2 tablets (40 mEq total) by mouth daily for 3 days.      Follow-up Information    Berkley Harvey, NP. Schedule an appointment as soon as possible for a visit in 1 week(s).   Specialty: Nurse Practitioner Contact information: [address] [place] [address] [phone]          Allergies  Allergen Reactions  . Tramadol Diarrhea    Other reaction(s): GI Upset (intolerance)    You were cared for by a hospitalist during your hospital stay. If you have any questions about your discharge medications or the care you received while you were in the hospital after you are discharged, you can call the unit and asked to speak with the hospitalist on call if the hospitalist that took care of you is not available. Once you are discharged, your primary care physician will handle any further medical issues. Please note that no refills for any discharge medications will be authorized once you are discharged, as it is imperative that you return to your primary care physician (or establish a relationship with a primary care physician if you do not have one) for your aftercare needs so that they can reassess your need for medications and monitor your lab values.   Procedures/Studies: Ct Abdomen Pelvis W Contrast  Result Date: 08/09/2018 CLINICAL DATA:  Persistent abdominal pain since discharged 3 days ago for small bowel obstruction. EXAM: CT ABDOMEN AND PELVIS WITH CONTRAST TECHNIQUE: Multidetector CT imaging of the abdomen and pelvis was performed using the standard protocol following bolus administration of intravenous contrast. CONTRAST:  163mL OMNIPAQUE IOHEXOL 300 MG/ML  SOLN COMPARISON:  CT abdomen pelvis dated August 05, 2018. FINDINGS: Lower  chest: No acute abnormality. New subsegmental atelectasis in the right lower lobe. Unchanged scarring at the left lung base. Hepatobiliary: No focal liver abnormality is seen. No gallstones, gallbladder wall thickening, or biliary dilatation. Pancreas: Mild atrophy. No ductal dilatation or surrounding inflammatory changes. Spleen: Normal in size without focal abnormality. Adrenals/Urinary Tract: Unchanged small bilateral adrenal adenomas. No renal calculi, focal lesion, or hydronephrosis. The bladder is unremarkable. Stomach/Bowel: Persistent diffuse small bowel dilatation to the level of the mid ileum, with transition point in the right lower quadrant (series 2, image 54; series 5, image 56). Mild edema in the small bowel mesentery is unchanged. No pneumatosis. The distal ileum and colon are decompressed. Mild left-sided colonic diverticulosis again noted. Normal appendix. Vascular/Lymphatic: Prior stent graft repair of an infrarenal abdominal aortic aneurysm. Aortoiliac atherosclerotic vascular disease. No enlarged abdominal or pelvic lymph nodes. Reproductive: Uterus and  bilateral adnexa are unremarkable. Other: Unchanged trace free fluid in the pelvis. No pneumoperitoneum. Musculoskeletal: No acute or significant osseous findings. IMPRESSION: 1. Persistent distal small bowel obstruction with transition point in the right lower quadrant. Electronically Signed   By: Titus Dubin M.D.   On: 08/09/2018 20:33   Dg Abd Portable 1v  Result Date: 08/14/2018 CLINICAL DATA:  Partial small bowel obstruction. EXAM: PORTABLE ABDOMEN - 1 VIEW COMPARISON:  Plain films of the abdomen dated 08/13/2018 and 08/12/2018. FINDINGS: Again noted are distended gas-filled loops of small bowel throughout the abdomen and upper pelvis, compatible with the description of small bowel obstruction. No significant change in the degree of bowel distension. Enteric tube appears adequately positioned in the stomach. Aorta bi-iliac stent in  place. No acute or suspicious osseous finding. IMPRESSION: Continued evidence of small bowel obstruction. No significant change compared to yesterday's exam. Electronically Signed   By: Franki Cabot M.D.   On: 08/14/2018 07:38   Dg Abd Portable 1v  Result Date: 08/13/2018 CLINICAL DATA:  Small-bowel obstruction EXAM: PORTABLE ABDOMEN - 1 VIEW COMPARISON:  Portable exam 0457 hours compared to 08/12/2018 FINDINGS: Aorto bi-iliac stent. Nasogastric tube projects over proximal stomach. Scattered retained contrast in colon. Persistent dilatation of numerous small bowel loops consistent with small bowel obstruction. No definite bowel wall thickening. Bones demineralized. IMPRESSION: Persistent small bowel dilatation consistent with small bowel obstruction, not significantly changed. Electronically Signed   By: Lavonia Dana M.D.   On: 08/13/2018 08:03   Dg Abd Portable 1v  Result Date: 08/12/2018 CLINICAL DATA:  Small bowel obstruction EXAM: PORTABLE ABDOMEN - 1 VIEW COMPARISON:  Yesterday FINDINGS: Gastric suction tube tip at the proximal stomach. Dilated small bowel measuring up to 3.7 cm, similar to prior. Less numerous loops of dilated bowel are seen. Oral contrast is still seen within nondilated colon. Aortic iliac stent grafting. No concerning mass effect or gas collection. IMPRESSION: Stable to mildly improved partial small bowel obstruction. Electronically Signed   By: Monte Fantasia M.D.   On: 08/12/2018 07:26   Dg Abd Portable 1v  Result Date: 08/11/2018 CLINICAL DATA:  Small bowel obstruction. EXAM: PORTABLE ABDOMEN - 1 VIEW COMPARISON:  Radiograph of August 10, 2018. FINDINGS: Distal tip of nasogastric tube is stable in proximal stomach. Status post stent graft repair of abdominal aortic aneurysm. Persistent small bowel dilatation is noted concerning for distal small bowel obstruction. Residual contrast remains within the colon which is nondilated. IMPRESSION: Stable small bowel dilatation is noted  concerning for distal small bowel obstruction. Electronically Signed   By: Marijo Conception M.D.   On: 08/11/2018 12:49   Dg Abd Portable 1v-small Bowel Obstruction Protocol-initial, 8 Hr Delay  Result Date: 08/10/2018 CLINICAL DATA:  Small-bowel obstruction. EXAM: PORTABLE ABDOMEN - 1 VIEW COMPARISON:  August 10, 2018 FINDINGS: There are persistent dilated loops of small bowel scattered throughout the abdomen. There is persistent oral contrast in the right hemicolon and transverse colon. This is not significantly changed from prior study. The nasogastric tube projects over the upper abdomen. Oral contrast is likely still within the small bowel. The patient is status post prior EVAR. IMPRESSION: 1. Oral contrast likely remains within the small bowel. 2. Persistent dilated loops of small bowel scattered throughout the abdomen, not significantly improved from prior study. 3. Oral contrast is noted in the right hemicolon which is unchanged from radiograph dated 08/10/2018 at approximately 8:45 a.m. 4. Nasogastric tube projects over the left upper quadrant. Electronically Signed   By:  Constance Holster M.D.   On: 08/10/2018 23:35   Dg Abd Portable 1v-small Bowel Protocol-position Verification  Result Date: 08/10/2018 CLINICAL DATA:  Nasogastric tube placement. EXAM: PORTABLE ABDOMEN - 1 VIEW COMPARISON:  CT scan of August 09, 2018. Radiographs of August 06, 2018. FINDINGS: Distal tip of nasogastric tube is seen in expected position of proximal stomach. Increased small bowel dilatation is noted concerning for distal small bowel obstruction. Residual contrast is noted in nondilated colon. Status post stent graft repair of abdominal aortic aneurysm. IMPRESSION: Nasogastric tube tip seen in expected position of proximal stomach. Increased small bowel dilatation is noted concerning for distal small bowel obstruction. Electronically Signed   By: Marijo Conception M.D.   On: 08/10/2018 09:23      The results of  significant diagnostics from this hospitalization (including imaging, microbiology, ancillary and laboratory) are listed below for reference.     Microbiology: Recent Results (from the past 240 hour(s))  SARS Coronavirus 2 (CEPHEID - Performed in [place] hospital lab), Hosp Order     Status: None   Collection Time: 08/10/18  1:53 AM   Specimen: Nasopharyngeal Swab  Result Value Ref Range Status   SARS Coronavirus 2 NEGATIVE NEGATIVE Final    Comment: (NOTE) If result is NEGATIVE SARS-CoV-2 target nucleic acids are NOT DETECTED. The SARS-CoV-2 RNA is generally detectable in upper and lower  respiratory specimens during the acute phase of infection. The lowest  concentration of SARS-CoV-2 viral copies this assay can detect is 250  copies / mL. A negative result does not preclude SARS-CoV-2 infection  and should not be used as the sole basis for treatment or other  patient management decisions.  A negative result may occur with  improper specimen collection / handling, submission of specimen other  than nasopharyngeal swab, presence of viral mutation(s) within the  areas targeted by this assay, and inadequate number of viral copies  (<250 copies / mL). A negative result must be combined with clinical  observations, patient history, and epidemiological information. If result is POSITIVE SARS-CoV-2 target nucleic acids are DETECTED. The SARS-CoV-2 RNA is generally detectable in upper and lower  respiratory specimens dur ing the acute phase of infection.  Positive  results are indicative of active infection with SARS-CoV-2.  Clinical  correlation with patient history and other diagnostic information is  necessary to determine patient infection status.  Positive results do  not rule out bacterial infection or co-infection with other viruses. If result is PRESUMPTIVE POSTIVE SARS-CoV-2 nucleic acids MAY BE PRESENT.   A presumptive positive result was obtained on the submitted specimen   and confirmed on repeat testing.  While 2019 novel coronavirus  (SARS-CoV-2) nucleic acids may be present in the submitted sample  additional confirmatory testing may be necessary for epidemiological  and / or clinical management purposes  to differentiate between  SARS-CoV-2 and other Sarbecovirus currently known to infect humans.  If clinically indicated additional testing with an alternate test  methodology [phone]) is advised. The SARS-CoV-2 RNA is generally  detectable in upper and lower respiratory sp ecimens during the acute  phase of infection. The expected result is Negative. Fact Sheet for Patients:  StrictlyIdeas.no Fact Sheet for Healthcare Providers: BankingDealers.co.za This test is not yet approved or cleared by the [place] FDA and has been authorized for detection and/or diagnosis of SARS-CoV-2 by FDA under an Emergency Use Authorization (EUA).  This EUA will remain in effect (meaning this test can be used) for the duration  of the COVID-19 declaration under Section 564(b)(1) of the Act, 21 U.S.C. section 360bbb-3(b)(1), unless the authorization is terminated or revoked sooner. Performed at [facility], [place] [address] [place], [place] [address]      Labs: BNP (last 3 results) No results for input(s): BNP in the last 8760 hours. Basic Metabolic Panel: Recent Labs  Lab 08/12/18 0517 08/12/18 1031 08/13/18 0453 08/14/18 0508 08/15/18 0514 08/16/18 0531  NA 143 143 136 135 135 135  K 3.3* 3.4* 3.0* 3.0* 3.3* 3.3*  CL 109 113* 103 96* 102 99  CO2 26 25 24 26 25 25   GLUCOSE 109* 104* 104* 99 86 98  BUN 16 16 10 11 9  7*  CREATININE 0.51 0.50 0.47 0.51 0.47 0.54  CALCIUM 8.6* 8.8* 8.6* 8.8* 8.4* 8.8*  MG 2.1  --  1.7 1.8 2.1 1.9   Liver Function Tests: Recent Labs  Lab 08/09/18 1859 08/10/18 0630  AST 22 21  ALT 17 16  ALKPHOS 75 68  BILITOT 0.8 0.6  PROT 7.1 6.1*  ALBUMIN 3.8  3.4*   Recent Labs  Lab 08/09/18 1859  LIPASE 26   No results for input(s): AMMONIA in the last 168 hours. CBC: Recent Labs  Lab 08/10/18 0630  08/12/18 0517 08/13/18 0453 08/14/18 0508 08/15/18 0514 08/16/18 0531  WBC 9.4   < > 10.2 10.9* 11.3* 9.4 9.9  NEUTROABS 6.4  --   --   --   --   --   --   HGB 12.9   < > 12.0 12.0 12.7 12.1 12.9  HCT 38.0   < > 37.8 36.8 38.5 37.6 40.4  MCV 90.7   < > 93.8 92.7 91.0 93.3 92.7  PLT 299   < > 329 319 369 368 418*   < > = values in this interval not displayed.   Cardiac Enzymes: No results for input(s): CKTOTAL, CKMB, CKMBINDEX, TROPONINI in the last 168 hours. BNP: Invalid input(s): POCBNP CBG: Recent Labs  Lab 08/14/18 1121 08/14/18 1601 08/14/18 2012 08/15/18 0726 08/15/18 1111  GLUCAP 96 86 89 76 77   D-Dimer No results for input(s): DDIMER in the last 72 hours. Hgb A1c No results for input(s): HGBA1C in the last 72 hours. Lipid Profile No results for input(s): CHOL, HDL, LDLCALC, TRIG, CHOLHDL, LDLDIRECT in the last 72 hours. Thyroid function studies No results for input(s): TSH, T4TOTAL, T3FREE, THYROIDAB in the last 72 hours.  Invalid input(s): FREET3 Anemia work up No results for input(s): VITAMINB12, FOLATE, FERRITIN, TIBC, IRON, RETICCTPCT in the last 72 hours. Urinalysis    Component Value Date/Time   COLORURINE YELLOW 08/09/2018 1814   APPEARANCEUR CLEAR 08/09/2018 1814   LABSPEC 1.015 08/09/2018 1814   PHURINE 6.0 08/09/2018 1814   GLUCOSEU NEGATIVE 08/09/2018 1814   HGBUR SMALL (A) 08/09/2018 1814   BILIRUBINUR NEGATIVE 08/09/2018 1814   KETONESUR 5 (A) 08/09/2018 1814   PROTEINUR NEGATIVE 08/09/2018 1814   UROBILINOGEN 0.2 07/18/2014 1042   NITRITE NEGATIVE 08/09/2018 1814   LEUKOCYTESUR TRACE (A) 08/09/2018 1814   Sepsis Labs Invalid input(s): PROCALCITONIN,  WBC,  LACTICIDVEN Microbiology Recent Results (from the past 240 hour(s))  SARS Coronavirus 2 (CEPHEID - Performed in [facility]  hospital lab), Hosp Order     Status: None   Collection Time: 08/10/18  1:53 AM   Specimen: Nasopharyngeal Swab  Result Value Ref Range Status   SARS Coronavirus 2 NEGATIVE NEGATIVE Final    Comment: (NOTE) If result is NEGATIVE  SARS-CoV-2 target nucleic acids are NOT DETECTED. The SARS-CoV-2 RNA is generally detectable in upper and lower  respiratory specimens during the acute phase of infection. The lowest  concentration of SARS-CoV-2 viral copies this assay can detect is 250  copies / mL. A negative result does not preclude SARS-CoV-2 infection  and should not be used as the sole basis for treatment or other  patient management decisions.  A negative result may occur with  improper specimen collection / handling, submission of specimen other  than nasopharyngeal swab, presence of viral mutation(s) within the  areas targeted by this assay, and inadequate number of viral copies  (<250 copies / mL). A negative result must be combined with clinical  observations, patient history, and epidemiological information. If result is POSITIVE SARS-CoV-2 target nucleic acids are DETECTED. The SARS-CoV-2 RNA is generally detectable in upper and lower  respiratory specimens dur ing the acute phase of infection.  Positive  results are indicative of active infection with SARS-CoV-2.  Clinical  correlation with patient history and other diagnostic information is  necessary to determine patient infection status.  Positive results do  not rule out bacterial infection or co-infection with other viruses. If result is PRESUMPTIVE POSTIVE SARS-CoV-2 nucleic acids MAY BE PRESENT.   A presumptive positive result was obtained on the submitted specimen  and confirmed on repeat testing.  While 2019 novel coronavirus  (SARS-CoV-2) nucleic acids may be present in the submitted sample  additional confirmatory testing may be necessary for epidemiological  and / or clinical management purposes  to differentiate  between  SARS-CoV-2 and other Sarbecovirus currently known to infect humans.  If clinically indicated additional testing with an alternate test  methodology [phone]) is advised. The SARS-CoV-2 RNA is generally  detectable in upper and lower respiratory sp ecimens during the acute  phase of infection. The expected result is Negative. Fact Sheet for Patients:  StrictlyIdeas.no Fact Sheet for Healthcare Providers: BankingDealers.co.za This test is not yet approved or cleared by the [place] FDA and has been authorized for detection and/or diagnosis of SARS-CoV-2 by FDA under an Emergency Use Authorization (EUA).  This EUA will remain in effect (meaning this test can be used) for the duration of the COVID-19 declaration under Section 564(b)(1) of the Act, 21 U.S.C. section 360bbb-3(b)(1), unless the authorization is terminated or revoked sooner. Performed at [facility], [place] [address] [place], [place] [address]      Time coordinating discharge:  I have spent 35 minutes face to face with the patient and on the ward discussing the patients care, assessment, plan and disposition with other care givers. >50% of the time was devoted counseling the patient about the risks and benefits of treatment/Discharge disposition and coordinating care.   SIGNED:   Damita Lack, MD  [facility] 08/16/2018, 11:05 AM   If 7PM-7AM, please contact night-coverage [email or web address]

## 2018-08-16 NOTE — Progress Notes (Signed)
Pts IV removed with a clean and dry dressing intact. Pt denies pain at the time of d/c with no s/s of distress noted. Pt educated on d/c instructions, follow ups, and medication will all questions answered at the time. Pt ambulated to the ED with nursing staff present to access her car.

## 2018-10-11 ENCOUNTER — Ambulatory Visit (INDEPENDENT_AMBULATORY_CARE_PROVIDER_SITE_OTHER): Payer: Medicare HMO | Admitting: Internal Medicine

## 2018-10-11 ENCOUNTER — Other Ambulatory Visit: Payer: Self-pay

## 2018-10-11 ENCOUNTER — Encounter: Payer: Self-pay | Admitting: Internal Medicine

## 2018-10-11 VITALS — BP 112/68 | HR 60 | Temp 97.2°F | Ht 66.0 in | Wt 138.0 lb

## 2018-10-11 DIAGNOSIS — K219 Gastro-esophageal reflux disease without esophagitis: Secondary | ICD-10-CM

## 2018-10-11 DIAGNOSIS — Z1211 Encounter for screening for malignant neoplasm of colon: Secondary | ICD-10-CM | POA: Diagnosis not present

## 2018-10-11 DIAGNOSIS — Z720 Tobacco use: Secondary | ICD-10-CM

## 2018-10-11 DIAGNOSIS — R935 Abnormal findings on diagnostic imaging of other abdominal regions, including retroperitoneum: Secondary | ICD-10-CM

## 2018-10-11 DIAGNOSIS — K56609 Unspecified intestinal obstruction, unspecified as to partial versus complete obstruction: Secondary | ICD-10-CM | POA: Diagnosis not present

## 2018-10-11 MED ORDER — NA SULFATE-K SULFATE-MG SULF 17.5-3.13-1.6 GM/177ML PO SOLN: 1.0000 | Freq: Once | ORAL | 0 refills | Status: AC

## 2018-10-11 NOTE — Patient Instructions (Signed)
You have been scheduled for a colonoscopy. Please follow written instructions given to you at your visit today.  Please pick up your prep supplies at the pharmacy within the next 1-3 days. If you use inhalers (even only as needed), please bring them with you on the day of your procedure.   

## 2018-10-11 NOTE — Progress Notes (Signed)
HISTORY OF PRESENT ILLNESS:  Gloria Morrison is a 68 y.o. female with a history of GERD for which she is on pantoprazole, and chronic tobacco abuse who is sent today by her primary care provider Gloria Abrahams, NP regarding recent hospitalization with small bowel obstruction.  Patient was seen in this office on one occasion December 02, 2016 as a self-referred patient regarding GERD as well as pharyngeal and respiratory complaints.  See that dictation for details.  She was scheduled for upper endoscopy to evaluate reflux symptoms and abdominal bloating but canceled her appointment and has not been seen since.  Current history is that of recurrent small bowel obstruction for which she was hospitalized in [place] (after recent discharge from [facility]) June 22 through August 16, 2018.  Presentation at that time was acute abdominal pain with distention.  Previously was having problems with vomiting.  A CT scan of the abdomen and pelvis (reviewed) revealed persistent distal small bowel obstruction with a transition point in the right lower quadrant.  No mass or other abnormalities.  She has not had abdominal pelvic surgery.  She has had an endovascular stent graft placed for AAA.  She was seen by surgery but able to be discharged with conservative treatment.  She was encouraged to make this appointment having had questions regarding small bowel obstruction as well as the need for follow-up colonoscopy.  Her last one was 10+ years ago.  Patient is currently feeling better.  Her GERD is managed with PPI.  She continues to smoke.  Eating well and having regular bowel movements.  Review of outside laboratories from August 16, 2018 shows normal CBC with hemoglobin 12.9.  REVIEW OF SYSTEMS:  All non-GI ROS negative unless otherwise stated in the HPI except for allergies, cough  Past Medical History:  Diagnosis Date  . AAA (abdominal aortic aneurysm) without rupture ([place]) 07/06/2014  . Complication of anesthesia    hard  to wake up (pt does not want Versed)  . Cough    allergies  . Essential hypertension    takes Lisinopril daily  . GERD (gastroesophageal reflux disease)    once in a while-will take OTC meds if needed  . Headache    occasionally  . History of bronchitis    a yr ago  . Hyperlipidemia    borderline;was on meds but has been off x 12 yrs   . Insomnia    takes Lorazepam as needed  . Microscopic hematuria   . SBO (small bowel obstruction) ([place]) 07/2018  . URI (upper respiratory infection)    Jan 2016    Past Surgical History:  Procedure Laterality Date  . ABDOMINAL AORTIC ENDOVASCULAR STENT GRAFT N/A 07/19/2014   Procedure: ABDOMINAL AORTIC ENDOVASCULAR STENT GRAFT;  Surgeon: Elam Dutch, MD;  Location: [place];  Service: Vascular;  Laterality: N/A;  . BREAST FIBROADENOMA SURGERY Left 1986   lumpectomy  . TONSILLECTOMY  as a child    Social History Gloria Morrison  reports that she has been smoking cigarettes. She started smoking about 27 years ago. She has been smoking about 0.25 packs per day. She has never used smokeless tobacco. She reports current alcohol use. She reports that she does not use drugs.  family history includes Crohn's disease in her brother; Hypertension in her sister; Melanoma in her father.  Allergies  Allergen Reactions  . Tramadol Diarrhea    Other reaction(s): GI Upset (intolerance)       PHYSICAL EXAMINATION: Vital signs: BP  112/68   Pulse 60   Temp (!) 97.2 F (36.2 C)   Ht 5\' 6"  (1.676 m)   Wt 138 lb (62.6 kg)   BMI 22.27 kg/m   Constitutional: generally well-appearing, no acute distress.  Strong odor of cigarette smoke.  Smokers voice Psychiatric: alert and oriented x3, cooperative Eyes: extraocular movements intact, anicteric, conjunctiva pink Mouth: oral pharynx moist, no lesions Neck: supple no lymphadenopathy Cardiovascular: heart regular rate and rhythm, no murmur Lungs: clear to auscultation bilaterally Abdomen: soft, nontender,  nondistended, no obvious ascites, no peritoneal signs, normal bowel sounds, no organomegaly Rectal: Deferred until colonoscopy Extremities: no clubbing, cyanosis, or lower extremity edema bilaterally Skin: no lesions on visible extremities Neuro: No focal deficits.  Cranial nerves intact  ASSESSMENT:  1.  Small bowel obstruction.  Etiology unclear.  No prior surgery.  May have had internal fibrous band or torsion.  No obvious small bowel lesion on imaging.  Resolved without recurrence symptoms over the past 2 months 2.  GERD.  Symptoms controlled with PPI 3.  Colon cancer screening.  Due for follow-up  PLAN:  1.  Discussion on small bowel obstruction.  We discussed etiologies.  Discussed outcomes and treatment.  Multiple questions answered to her satisfaction 2.  Reflux precautions.  Continue PPI.  Lowest dose to control reflux symptoms 3.  Stop smoking 4.  Schedule colonoscopy for colon cancer screening and to rule out right-sided or ileal lesion to explain her recent problems obstruction.The nature of the procedure, as well as the risks, benefits, and alternatives were carefully and thoroughly reviewed with the patient. Ample time for discussion and questions allowed. The patient understood, was satisfied, and agreed to proceed. 5.  Patient were to have recurrent small bowel obstruction, would consider advanced imaging such as CT or MR enterography.  Possibly capsule endoscopy.

## 2018-10-27 ENCOUNTER — Encounter: Payer: Self-pay | Admitting: Internal Medicine

## 2018-11-08 ENCOUNTER — Telehealth: Payer: Self-pay

## 2018-11-08 NOTE — Telephone Encounter (Signed)
Covid-19 screening questions   Do you now or have you had a fever in the last 14 days? NO   Do you have any respiratory symptoms of shortness of breath or cough now or in the last 14 days? NO  Do you have any family members or close contacts with diagnosed or suspected Covid-19 in the past 14 days? NO  Have you been tested for Covid-19 and found to be positive? NO        

## 2018-11-09 ENCOUNTER — Encounter: Payer: Self-pay | Admitting: Internal Medicine

## 2018-11-09 ENCOUNTER — Other Ambulatory Visit: Payer: Self-pay | Admitting: Internal Medicine

## 2018-11-09 ENCOUNTER — Other Ambulatory Visit: Payer: Self-pay

## 2018-11-09 ENCOUNTER — Ambulatory Visit (AMBULATORY_SURGERY_CENTER): Payer: Medicare HMO | Admitting: Internal Medicine

## 2018-11-09 VITALS — BP 149/88 | HR 64 | Temp 97.8°F | Resp 21 | Ht 66.0 in | Wt 138.0 lb

## 2018-11-09 DIAGNOSIS — D127 Benign neoplasm of rectosigmoid junction: Secondary | ICD-10-CM | POA: Diagnosis not present

## 2018-11-09 DIAGNOSIS — K56609 Unspecified intestinal obstruction, unspecified as to partial versus complete obstruction: Secondary | ICD-10-CM | POA: Diagnosis not present

## 2018-11-09 DIAGNOSIS — Z1211 Encounter for screening for malignant neoplasm of colon: Secondary | ICD-10-CM

## 2018-11-09 DIAGNOSIS — D128 Benign neoplasm of rectum: Secondary | ICD-10-CM | POA: Diagnosis not present

## 2018-11-09 DIAGNOSIS — R935 Abnormal findings on diagnostic imaging of other abdominal regions, including retroperitoneum: Secondary | ICD-10-CM

## 2018-11-09 DIAGNOSIS — D122 Benign neoplasm of ascending colon: Secondary | ICD-10-CM | POA: Diagnosis not present

## 2018-11-09 DIAGNOSIS — D123 Benign neoplasm of transverse colon: Secondary | ICD-10-CM | POA: Diagnosis not present

## 2018-11-09 MED ORDER — SODIUM CHLORIDE 0.9 % IV SOLN: 500.0000 mL | Freq: Once | INTRAVENOUS | Status: DC

## 2018-11-09 NOTE — Op Note (Signed)
[place] Patient Name: Gloria Morrison Procedure Date: 11/09/2018 9:11 AM MRN: [id number] Endoscopist: Docia Chuck. Henrene Pastor , MD Age: 68 Referring MD:  Date of Birth: Jun 17, 1950 Gender: Female Account #: [id number] Procedure:                Colonoscopy with cold (4) and hot snare                            polypectomies; submucosal injection Indications:              Screening for colorectal malignant neoplasm Medicines:                Monitored Anesthesia Care Procedure:                Pre-Anesthesia Assessment:                           - Prior to the procedure, a History and Physical                            was performed, and patient medications and                            allergies were reviewed. The patient's tolerance of                            previous anesthesia was also reviewed. The risks                            and benefits of the procedure and the sedation                            options and risks were discussed with the patient.                            All questions were answered, and informed consent                            was obtained. Prior Anticoagulants: The patient has                            taken no previous anticoagulant or antiplatelet                            agents. ASA Grade Assessment: II - A patient with                            mild systemic disease. After reviewing the risks                            and benefits, the patient was deemed in                            satisfactory condition to undergo the procedure.  After obtaining informed consent, the colonoscope                            was passed under direct vision. Throughout the                            procedure, the patient's blood pressure, pulse, and                            oxygen saturations were monitored continuously. The                            Colonoscope was introduced through the anus and   advanced to the the cecum, identified by                            appendiceal orifice and ileocecal valve. The                            ileocecal valve, appendiceal orifice, and rectum                            were photographed. The quality of the bowel                            preparation was excellent. The colonoscopy was                            performed without difficulty. The patient tolerated                            the procedure well. The bowel preparation used was                            SUPREP via split dose instruction. Scope In: 9:17:31 AM Scope Out: 9:54:24 AM Scope Withdrawal Time: 0 hours 29 minutes 57 seconds  Total Procedure Duration: 0 hours 36 minutes 53 seconds  Findings:                 A 35 mm polyp was found in the recto-sigmoid colon                            (approximately 20 cm from the anal verge). The                            polyp was semi-pedunculated. The polyp was removed                            with a hot snare in 3 pieces. Resection and                            retrieval were complete. Area was tattooed with an  injection of Spot (carbon black) placed at and                            immediately distal (downstream) from the resection                            site. See images.                           Four polyps were found in the rectum, transverse                            colon and ascending colon. The polyps were 2 to 5                            mm in size. These polyps were removed with a cold                            snare. Resection and retrieval were complete.                           Multiple medium-mouthed diverticula were found in                            the sigmoid colon.                           The exam was otherwise without abnormality on                            direct and retroflexion views. Complications:            No immediate complications. Estimated blood loss:                             None. Estimated Blood Loss:     Estimated blood loss: none. Impression:               - One 35 mm polyp at the recto-sigmoid colon,                            removed with a hot snare. Resected and retrieved.                            Tattooed.                           - Four 2 to 5 mm polyps in the rectum, in the                            transverse colon and in the ascending colon,                            removed with a cold snare. Resected and retrieved.                           -  Diverticulosis in the sigmoid colon.                           - The examination was otherwise normal on direct                            and retroflexion views. Recommendation:           - Repeat colonoscopy date to be determined after                            pending pathology results are reviewed for                            surveillance.                           - Patient has a contact number available for                            emergencies. The signs and symptoms of potential                            delayed complications were discussed with the                            patient. Return to normal activities tomorrow.                            Written discharge instructions were provided to the                            patient.                           - Resume previous diet.                           - Continue present medications.                           - Await pathology results. Docia Chuck. Henrene Pastor, MD 11/09/2018 10:07:58 AM This report has been signed electronically.

## 2018-11-09 NOTE — Progress Notes (Signed)
Called to room to assist during endoscopic procedure.  Patient ID and intended procedure confirmed with present staff. Received instructions for my participation in the procedure from the performing physician.  

## 2018-11-09 NOTE — Progress Notes (Signed)
Pt's states no medical or surgical changes since previsit or office visit. 

## 2018-11-09 NOTE — Progress Notes (Signed)
PT taken to PACU. Monitors in place. VSS. Report given to RN. 

## 2018-11-09 NOTE — Patient Instructions (Signed)
Handouts given for diverticulosis and polyps.  YOU HAD AN ENDOSCOPIC PROCEDURE TODAY AT THE [facility]:   Refer to the procedure report that was given to you for any specific questions about what was found during the examination.  If the procedure report does not answer your questions, please call your gastroenterologist to clarify.  If you requested that your care partner not be given the details of your procedure findings, then the procedure report has been included in a sealed envelope for you to review at your convenience later.  YOU SHOULD EXPECT: Some feelings of bloating in the abdomen. Passage of more gas than usual.  Walking can help get rid of the air that was put into your GI tract during the procedure and reduce the bloating. If you had a lower endoscopy (such as a colonoscopy or flexible sigmoidoscopy) you may notice spotting of blood in your stool or on the toilet paper. If you underwent a bowel prep for your procedure, you may not have a normal bowel movement for a few days.  Please Note:  You might notice some irritation and congestion in your nose or some drainage.  This is from the oxygen used during your procedure.  There is no need for concern and it should clear up in a day or so.  SYMPTOMS TO REPORT IMMEDIATELY:   Following lower endoscopy (colonoscopy or flexible sigmoidoscopy):  Excessive amounts of blood in the stool  Significant tenderness or worsening of abdominal pains  Swelling of the abdomen that is new, acute  Fever of 100F or higher  For urgent or emergent issues, a gastroenterologist can be reached at any hour by calling [phone].   DIET:  We do recommend a small meal at first, but then you may proceed to your regular diet.  Drink plenty of fluids but you should avoid alcoholic beverages for 24 hours.  ACTIVITY:  You should plan to take it easy for the rest of today and you should NOT DRIVE or use heavy machinery until tomorrow (because of  the sedation medicines used during the test).    FOLLOW UP: Our staff will call the number listed on your records 48-72 hours following your procedure to check on you and address any questions or concerns that you may have regarding the information given to you following your procedure. If we do not reach you, we will leave a message.  We will attempt to reach you two times.  During this call, we will ask if you have developed any symptoms of COVID 19. If you develop any symptoms (ie: fever, flu-like symptoms, shortness of breath, cough etc.) before then, please call [phone].  If you test positive for Covid 19 in the 2 weeks post procedure, please call and report this information to us.    If any biopsies were taken you will be contacted by phone or by letter within the next 1-3 weeks.  Please call us at [phone] if you have not heard about the biopsies in 3 weeks.    SIGNATURES/CONFIDENTIALITY: You and/or your care partner have signed paperwork which will be entered into your electronic medical record.  These signatures attest to the fact that that the information above on your After Visit Summary has been reviewed and is understood.  Full responsibility of the confidentiality of this discharge information lies with you and/or your care-partner. 

## 2018-11-11 ENCOUNTER — Telehealth: Payer: Self-pay | Admitting: *Deleted

## 2018-11-11 NOTE — Telephone Encounter (Signed)
  Follow up Call-  Call back number 11/09/2018  Post procedure Call Back phone  # [phone]  Permission to leave phone message Yes  Some recent data might be hidden     Patient questions:  Do you have a fever, pain , or abdominal swelling? No. Pain Score  0 *  Have you tolerated food without any problems? Yes.    Have you been able to return to your normal activities? Yes.    Do you have any questions about your discharge instructions: Diet   No. Medications  No. Follow up visit  No.  Do you have questions or concerns about your Care? Yes.    Actions: * If pain score is 4 or above: No action needed, pain <4.  1. Have you developed a fever since your procedure? no  2.   Have you had an respiratory symptoms (SOB or cough) since your procedure? no  3.   Have you tested positive for COVID 19 since your procedure yes, no  4.   Have you had any family members/close contacts diagnosed with the COVID 19 since your procedure?  no   If yes to any of these questions please route to Joylene John, RN and Alphonsa Gin, Therapist, sports.

## 2018-11-17 ENCOUNTER — Telehealth: Payer: Self-pay | Admitting: Internal Medicine

## 2018-11-17 ENCOUNTER — Encounter: Payer: Self-pay | Admitting: Internal Medicine

## 2018-11-17 NOTE — Telephone Encounter (Signed)
Pt states she has had some constipation after procedure and she wanted to know what to do. Discussed with pt that she can take 1-2 doses of miralax daily as needed. Pt also requesting results from procedure. Please advise.

## 2018-11-17 NOTE — Telephone Encounter (Signed)
I reviewed the patient's pathology.  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  After reviewing with the pathologist, the lesion is confined without high risk features and highly likely cured with polypectomy.  Having said this, she requires short-term follow-up.  We will plan repeat colonoscopy in about 3 months.

## 2018-11-17 NOTE — Telephone Encounter (Signed)
Pt called looking for her path results, she is aware that we do not have them yet but she said hat she has a question for you and would like a call back.

## 2018-12-02 ENCOUNTER — Telehealth: Payer: Self-pay | Admitting: Internal Medicine

## 2018-12-02 NOTE — Telephone Encounter (Signed)
Please see is she would come in for a CBC and to see an APP tomorrow. She needs to go to the ED with any further bleeding or symptoms associated with symptomatic anemia such as fatigue, weakness, shortness of breath, chest pain, shortness of breath with exertion.   Thank you.

## 2018-12-02 NOTE — Telephone Encounter (Signed)
I tried to call the patient at [phone]. It went immediately to voicemail.  The polyps that were removed at the time of her colonoscopy included a very large polyp that would be at high risk for bleeding. Given the severity of her symptoms, I recommend that she go to the ER for both blood work and further evaluation. She may need overnight observation and if the bleeding continues she may need another colonoscopy to evaluate and treat the source of the bleeding.

## 2018-12-02 NOTE — Telephone Encounter (Signed)
Pt reported that she is experiencing rectal bleeding.  She stated that she will be able to talk after 2 PM.

## 2018-12-02 NOTE — Telephone Encounter (Signed)
Gloria Morrison pt calling stating that she has been having BRBPR with bowel movements since Monday. Pt had colon done 11/09/18 and had some polyps removed. Pt states Monday there was quite a bit of blood, Tues and Wed a little bit , but today she reports she passed another large amount. Did not see hemorrhoids mentioned on report. Pt wants to know what she should do. Please advise as DOD.

## 2018-12-02 NOTE — Telephone Encounter (Signed)
I spoke with the pt and Gloria Morrison advised that Gloria Morrison will not be going to the ED.  Gloria Morrison says says Gloria Morrison has to much to do at home.  Gloria Morrison said to let Dr Tarri Glenn know that Gloria Morrison will just wait it out and call back tomorrow.

## 2018-12-02 NOTE — Telephone Encounter (Signed)
Tried to reach the pt back; no answer and no voice mail.

## 2018-12-06 NOTE — Telephone Encounter (Signed)
Noted. I appreciate it

## 2018-12-20 ENCOUNTER — Telehealth: Payer: Self-pay | Admitting: Internal Medicine

## 2018-12-21 ENCOUNTER — Telehealth: Payer: Self-pay

## 2018-12-21 MED ORDER — HYDROCORTISONE ACETATE 25 MG RE SUPP: 25.0000 mg | Freq: Every day | RECTAL | 0 refills | Status: AC

## 2018-12-21 NOTE — Telephone Encounter (Signed)
Left message for pt to call back.  Spoke with pt and she is aware. Script sent to pharmacy for pt.

## 2018-12-21 NOTE — Telephone Encounter (Signed)
Patient called back to add that right before bleeding started she was suffering from constipation which is still happening intermittently.

## 2018-12-21 NOTE — Telephone Encounter (Signed)
The bleeding is coming from 1 of 2 things: 1.  She has small internal hemorrhoids.  This is possible.  As such, treat with Anusol suppositories (or equivalent), 1 at night for 2 weeks 2.  She could have some intermittent minor bleeding from the scab from where the large polyp was taken off.  If this is the case, give it time as it will resolve

## 2018-12-21 NOTE — Telephone Encounter (Signed)
Left message for pt to call back.  Spoke with pt and she is aware of Dr. Blanch Media recommendations.

## 2018-12-21 NOTE — Telephone Encounter (Signed)
Gloria Morrison, Also, with the report of constipation, she should take 2 tablespoons of Metamucil or Citrucel daily in 14 ounces of water or juice

## 2018-12-21 NOTE — Telephone Encounter (Signed)
Patient called to communicate that she is still seeing brb on the tissue when she has a bm.  She said it is not severe, there are no clots, but it seems to be happening every day.  She was unhappy with the exchange that included Dr. Payton Emerald advice (see phone note 12/02/2018) and reiterated that she adamantly did not want to go to the ED.  Please advise.

## 2018-12-23 ENCOUNTER — Other Ambulatory Visit: Payer: Self-pay

## 2018-12-23 DIAGNOSIS — I714 Abdominal aortic aneurysm, without rupture, unspecified: Secondary | ICD-10-CM

## 2018-12-23 DIAGNOSIS — R0989 Other specified symptoms and signs involving the circulatory and respiratory systems: Secondary | ICD-10-CM

## 2018-12-29 ENCOUNTER — Telehealth (HOSPITAL_COMMUNITY): Payer: Self-pay | Admitting: *Deleted

## 2018-12-29 NOTE — Telephone Encounter (Signed)

## 2018-12-30 ENCOUNTER — Encounter: Payer: Self-pay | Admitting: Family

## 2018-12-30 ENCOUNTER — Ambulatory Visit (HOSPITAL_COMMUNITY)
Admission: RE | Admit: 2018-12-30 | Discharge: 2018-12-30 | Disposition: A | Discharge status: Home / Self Care | Payer: Medicare HMO | Source: Ambulatory Visit | Attending: Family | Admitting: Family

## 2018-12-30 ENCOUNTER — Other Ambulatory Visit: Payer: Self-pay

## 2018-12-30 ENCOUNTER — Ambulatory Visit (INDEPENDENT_AMBULATORY_CARE_PROVIDER_SITE_OTHER)
Admission: RE | Admit: 2018-12-30 | Discharge: 2018-12-30 | Disposition: A | Discharge status: Home / Self Care | Payer: Medicare HMO | Source: Ambulatory Visit | Attending: Family | Admitting: Family

## 2018-12-30 ENCOUNTER — Ambulatory Visit (INDEPENDENT_AMBULATORY_CARE_PROVIDER_SITE_OTHER): Payer: Medicare HMO | Admitting: Family

## 2018-12-30 VITALS — BP 132/74 | HR 60 | Temp 97.9°F | Resp 14 | Ht 66.0 in | Wt 139.0 lb

## 2018-12-30 DIAGNOSIS — I714 Abdominal aortic aneurysm, without rupture, unspecified: Secondary | ICD-10-CM

## 2018-12-30 DIAGNOSIS — R0989 Other specified symptoms and signs involving the circulatory and respiratory systems: Secondary | ICD-10-CM | POA: Diagnosis not present

## 2018-12-30 DIAGNOSIS — Z95828 Presence of other vascular implants and grafts: Secondary | ICD-10-CM | POA: Diagnosis not present

## 2018-12-30 DIAGNOSIS — Z87891 Personal history of nicotine dependence: Secondary | ICD-10-CM | POA: Diagnosis not present

## 2018-12-30 NOTE — Patient Instructions (Addendum)
Before your next abdominal ultrasound:  Avoid gas forming foods and beverages the day before the test.   Take two Extra-Strength Gas-X capsules at bedtime the night before the test. Take another two Extra-Strength Gas-X capsules in the middle of the night if you get up to the restroom, if not, first thing in the morning with water.  Do not chew gum.     

## 2018-12-30 NOTE — Progress Notes (Signed)
[facility] [place]  CC: Follow up s/p Endovascular Repair of Abdominal Aortic Aneurysm    History of Present Illness  Gloria Morrison is a 68 y.o. ([birth year]) female who is s/p endovascular aneurysm repair of abdominal aortic aneurysm June 2016 by Dr. Oneida Alar.   Dr. Oneida Alar last evaluated pt on 10-09-17. At that time ultrasound of her abdominal aorta on 10/07/2016 showed that the aortic diameter was  4.1 cm decreased from 7 cm preoperatively. Doing well status post endovascular aneurysm repair of abdominal aortic aneurysm. Continued decrease in size of her aortic diameter. Patient was to follow-up in one year with our nurse practitioner an aortic ultrasound at that time.  She denies back pain or abdominal pain.  She denies claudication type symptoms with walking.  She denies any known history of stroke or TIA.   She denies dyspnea, denies chest pain.   She states that she had a partially blocked bowel in June 2020, no surgery needed.   Diabetic: No Tobaccos use: former smoker, quit about Spring of 2020, started in her 69's; however, strong odor of cigarette smoke on her person   Past Medical History:  Diagnosis Date  . AAA (abdominal aortic aneurysm) without rupture ([place]) 07/06/2014  . Complication of anesthesia    hard to wake up (pt does not want Versed)  . Cough    allergies  . Essential hypertension    takes Lisinopril daily  . GERD (gastroesophageal reflux disease)    once in a while-will take OTC meds if needed  . Headache    occasionally  . History of bronchitis    a yr ago  . Hyperlipidemia    borderline;was on meds but has been off x 12 yrs   . Insomnia    takes Lorazepam as needed  . Microscopic hematuria   . SBO (small bowel obstruction) ([place]) 07/2018  . URI (upper respiratory infection)    Jan 2016   Past Surgical History:  Procedure Laterality Date  . ABDOMINAL AORTIC ENDOVASCULAR STENT GRAFT N/A 07/19/2014   Procedure: ABDOMINAL  AORTIC ENDOVASCULAR STENT GRAFT;  Surgeon: Elam Dutch, MD;  Location: [facility];  Service: Vascular;  Laterality: N/A;  . BREAST FIBROADENOMA SURGERY Left 1986   lumpectomy  . TONSILLECTOMY  as a child   Social History Social History   Tobacco Use  . Smoking status: Light Tobacco Smoker    Packs/day: 0.25    Types: Cigarettes    Start date: 02/18/1991    Last attempt to quit: 03/20/2016    Years since quitting: 2.7  . Smokeless tobacco: Never Used  Substance Use Topics  . Alcohol use: Yes    Comment: 5-6 drinks per week  . Drug use: No   Family History Family History  Problem Relation Age of Onset  . Melanoma Father   . Hypertension Sister   . Crohn's disease Brother    Current Outpatient Medications on File Prior to Visit  Medication Sig Dispense Refill  . amLODipine (NORVASC) 5 MG tablet Take 1 tablet by mouth daily.    Marland Kitchen aspirin EC 81 MG tablet Take 81 mg by mouth daily.     . calcium carbonate 1250 MG capsule Take 1,250 mg by mouth.    . Coenzyme Q10 (CO Q 10) 10 MG CAPS Take 100 mg by mouth.    . estradiol (ESTRACE) 0.1 MG/GM vaginal cream Place 1 Applicatorful vaginally once a week.     . hydrocortisone (ANUSOL-HC) 25 MG suppository  Place 1 suppository (25 mg total) rectally at bedtime. 14 suppository 0  . LORazepam (ATIVAN) 1 MG tablet Take 1 mg by mouth as needed for anxiety.     . Multiple Vitamins-Minerals (ONE DAILY PLUS MINERALS) TABS Take 1 tablet by mouth.     . pantoprazole (PROTONIX) 40 MG tablet Take 40 mg by mouth daily.    . potassium chloride SA (K-DUR) 20 MEQ tablet Take 2 tablets (40 mEq total) by mouth daily for 3 days. 6 tablet 0   No current facility-administered medications on file prior to visit.    Allergies  Allergen Reactions  . Versed [Midazolam]   . Tramadol Diarrhea    Other reaction(s): GI Upset (intolerance)     ROS: See HPI for pertinent positives and negatives.  Physical Examination  Vitals:   12/30/18 1011  BP: 132/74   Pulse: 60  Resp: 14  Temp: 97.9 F (36.6 C)  TempSrc: Temporal  SpO2: 97%  Weight: 139 lb (63 kg)  Height: 5\' 6"  (1.676 m)   Body mass index is 22.44 kg/m.  General: A&O x 3, WD, female in NAD HEENT: No gross abnormalities  Pulmonary: Sym exp, respirations are non labored, faor air movement in all fields, no rales, rhonchi, or wheezes Cardiac: Regular rhythm and rate, no murmur appreciated  Vascular: Vessel Right Left  Radial Palpable Palpable  Carotid  without bruit  without bruit  Aorta Not palpable N/A  Femoral Palpable Palpable  Popliteal Not palpable Not palpable  PT Palpable Palpable  DP Palpable Palpable   Gastrointestinal: soft, NTND, -G/R, - HSM, - palpable masses, - CVAT B. Musculoskeletal: M/S 5/5 throughout, extremities without ischemic changes Skin: No rashes, no ulcers, no cellulitis.   Neurologic: Pain and light touch intact in extremities, Motor exam as listed above. Psychiatric: Normal thought content, mood appropriate for clinical situation.    DATA  EVAR Duplex    Endovascular Aortic Repair (EVAR) duplex (12-30-18): +----------+----------------+-------------------+-------------------+           Diameter AP (cm)Diameter Trans (cm)Velocities (cm/sec) +----------+----------------+-------------------+-------------------+ Aorta     3.32            3.54               73                  +----------+----------------+-------------------+-------------------+ Right Limb1.46            1.49               52                  +----------+----------------+-------------------+-------------------+ Left Limb 1.40            1.38               51                  +----------+----------------+-------------------+-------------------+ Summary: Abdominal Aorta: Patent endovascular aneurysm repair with no evidence of endoleak.   Previous(Date: 12-04-17)  AAA sac size: 3.5 cm; Right CIA: 1.6 cm; Left CIA: 1.6 cm  no endoleak detected  CTA  Abd/Pelvis Duplex (Date: 01-25-15)  AAA sac size: 5.8 cm    Bilateral Popliteal Artery Duplex (12-30-18): +---------------+-------+-----------+--------+--------+-----+--------+ Right PoplitealAP (cm)Transv (cm)WaveformStenosisShapeComments +---------------+-------+-----------+--------+--------+-----+--------+ Proximal       0.59   0.56       biphasic                      +---------------+-------+-----------+--------+--------+-----+--------+ Mid  0.57   0.56       biphasic                      +---------------+-------+-----------+--------+--------+-----+--------+ Distal         0.64   0.68       biphasic                      +---------------+-------+-----------+--------+--------+-----+--------+  +--------------+-------+-----------+--------+--------+-----+--------+ Left PoplitealAP (cm)Transv (cm)WaveformStenosisShapeComments +--------------+-------+-----------+--------+--------+-----+--------+ Proximal      0.80   0.82       biphasic                      +--------------+-------+-----------+--------+--------+-----+--------+ Mid           0.79   0.82       biphasic                      +--------------+-------+-----------+--------+--------+-----+--------+ Distal        0.61   0.61       biphasic                      +--------------+-------+-----------+--------+--------+-----+--------+ Summary: Right: Patent popliteal artery with no evidence of aneurysmal dilatation.  Left: Patent popliteal artery with mild dilatation of the proximal popliteal/distal superficial femoral artery. Mild disease is observed without hemodynamic changes.     Medical Decision Making  Gloria Morrison is a 69 y.o. female who presents s/p EVAR (Date: June 2016).  Pt is asymptomatic with a stable sac size at 3.5 cm. No endoleak.   Popliteal artery duplex 12-30-18: normal sized right, slightly enlarged left popliteal artery at 0.82 cm,  repair considered at >3cm.    I discussed with the patient the importance of surveillance of the endograft.  The next endograft duplex will be scheduled for 12 months.  The patient will follow up with [place] in 12 months with these studies.  I emphasized the importance of maximal medical management including strict control of blood pressure, blood glucose, and lipid levels, antiplatelet agents, obtaining regular exercise, and cessation of smoking.   Thank you for allowing [place] to participate in this patient's care.  Clemon Chambers, RN, MSN, FNP-C [facility] [place] Office: [phone]  Clinic Physician: Oneida Alar  12/30/2018, 10:41 AM

## 2018-12-31 ENCOUNTER — Ambulatory Visit: Payer: Medicare HMO | Admitting: Family

## 2018-12-31 ENCOUNTER — Encounter (HOSPITAL_COMMUNITY): Payer: Medicare HMO

## 2018-12-31 ENCOUNTER — Other Ambulatory Visit (HOSPITAL_COMMUNITY): Payer: Medicare HMO

## 2019-01-04 ENCOUNTER — Other Ambulatory Visit: Payer: Self-pay

## 2019-01-04 DIAGNOSIS — I714 Abdominal aortic aneurysm, without rupture, unspecified: Secondary | ICD-10-CM

## 2019-02-16 ENCOUNTER — Telehealth: Payer: Self-pay

## 2019-02-16 ENCOUNTER — Telehealth: Payer: Self-pay | Admitting: Internal Medicine

## 2019-02-16 NOTE — Telephone Encounter (Signed)
Patient called and asked to speak directly to you- she states Patient has a couple questions- she said you were the one that has been helping her. She states after her last colonoscopy she had some complications and she is getting ready to schedule another one and wants to ask some questions first.

## 2019-02-16 NOTE — Telephone Encounter (Signed)
Spoke with patient and answered some questions about the prep (I'm going to give her a sample of Plenvu this time) and scheduling her 3 month recall colonoscopy. She also requested that she have the same CRNA she had last time because she felt so comfortable with him.  I spoke with Judson Roch Monday in the [facility] and she told me the CRNA was Lennette Bihari and that it wouldn't be a problem to make sure he worked with Dr. Henrene Pastor for her procedure.  Called patient back to relay this.  We scheduled her colonoscopy and Covid testing.  Patient tends to suffer from anxiety and was very grateful that we accommodated her concerns and request.  I told patient I would call her back next week and plan for her to come in the week before the procedure (scheduled for 03/11/19) and I would instruct her.  Patient agreed.

## 2019-03-04 ENCOUNTER — Telehealth: Payer: Self-pay | Admitting: Internal Medicine

## 2019-03-04 NOTE — Telephone Encounter (Signed)
Spoke with patient and asked if she could come to the office Monday to go over the instructions for her upcoming colonoscopy.  Patient agreed.

## 2019-03-08 NOTE — Telephone Encounter (Signed)
Patient came in to the office to be instructed on her colonoscopy by Plenvu.  She signed the acknowledgement and informed consent.  I assured her I would double check Thursday to make sure Lennette Bihari, was in fact, going to be the CRNA for her case.  She agreed.

## 2019-03-09 ENCOUNTER — Ambulatory Visit (INDEPENDENT_AMBULATORY_CARE_PROVIDER_SITE_OTHER): Payer: Medicare HMO

## 2019-03-09 ENCOUNTER — Other Ambulatory Visit: Payer: Self-pay | Admitting: Internal Medicine

## 2019-03-09 DIAGNOSIS — Z1159 Encounter for screening for other viral diseases: Secondary | ICD-10-CM

## 2019-03-10 LAB — SARS CORONAVIRUS 2 (TAT 6-24 HRS): SARS Coronavirus 2: NEGATIVE

## 2019-03-11 ENCOUNTER — Other Ambulatory Visit: Payer: Self-pay

## 2019-03-11 ENCOUNTER — Ambulatory Visit (AMBULATORY_SURGERY_CENTER): Payer: Medicare HMO | Admitting: Internal Medicine

## 2019-03-11 ENCOUNTER — Encounter: Payer: Self-pay | Admitting: Internal Medicine

## 2019-03-11 VITALS — BP 150/87 | HR 62 | Temp 99.0°F | Resp 12 | Ht 66.0 in | Wt 135.0 lb

## 2019-03-11 DIAGNOSIS — D123 Benign neoplasm of transverse colon: Secondary | ICD-10-CM

## 2019-03-11 DIAGNOSIS — Z85038 Personal history of other malignant neoplasm of large intestine: Secondary | ICD-10-CM

## 2019-03-11 DIAGNOSIS — Z86010 Personal history of colonic polyps: Secondary | ICD-10-CM | POA: Diagnosis not present

## 2019-03-11 DIAGNOSIS — D124 Benign neoplasm of descending colon: Secondary | ICD-10-CM

## 2019-03-11 DIAGNOSIS — D122 Benign neoplasm of ascending colon: Secondary | ICD-10-CM

## 2019-03-11 MED ORDER — SODIUM CHLORIDE 0.9 % IV SOLN: 500.0000 mL | Freq: Once | INTRAVENOUS | Status: DC

## 2019-03-11 NOTE — Progress Notes (Signed)
Called to room to assist during endoscopic procedure.  Patient ID and intended procedure confirmed with present staff. Received instructions for my participation in the procedure from the performing physician.  

## 2019-03-11 NOTE — Patient Instructions (Signed)
Handouts on diverticulosis and polyps given to you today  Await pathology results    YOU HAD AN ENDOSCOPIC PROCEDURE TODAY AT [facility]:   Refer to the procedure report that was given to you for any specific questions about what was found during the examination.  If the procedure report does not answer your questions, please call your gastroenterologist to clarify.  If you requested that your care partner not be given the details of your procedure findings, then the procedure report has been included in a sealed envelope for you to review at your convenience later.  YOU SHOULD EXPECT: Some feelings of bloating in the abdomen. Passage of more gas than usual.  Walking can help get rid of the air that was put into your GI tract during the procedure and reduce the bloating. If you had a lower endoscopy (such as a colonoscopy or flexible sigmoidoscopy) you may notice spotting of blood in your stool or on the toilet paper. If you underwent a bowel prep for your procedure, you may not have a normal bowel movement for a few days.  Please Note:  You might notice some irritation and congestion in your nose or some drainage.  This is from the oxygen used during your procedure.  There is no need for concern and it should clear up in a day or so.  SYMPTOMS TO REPORT IMMEDIATELY:   Following lower endoscopy (colonoscopy or flexible sigmoidoscopy):  Excessive amounts of blood in the stool  Significant tenderness or worsening of abdominal pains  Swelling of the abdomen that is new, acute  Fever of 100F or higher  For urgent or emergent issues, a gastroenterologist can be reached at any hour by calling [phone].   DIET:  We do recommend a small meal at first, but then you may proceed to your regular diet.  Drink plenty of fluids but you should avoid alcoholic beverages for 24 hours.  ACTIVITY:  You should plan to take it easy for the rest of today and you should NOT DRIVE or use  heavy machinery until tomorrow (because of the sedation medicines used during the test).    FOLLOW UP: Our staff will call the number listed on your records 48-72 hours following your procedure to check on you and address any questions or concerns that you may have regarding the information given to you following your procedure. If we do not reach you, we will leave a message.  We will attempt to reach you two times.  During this call, we will ask if you have developed any symptoms of COVID 19. If you develop any symptoms (ie: fever, flu-like symptoms, shortness of breath, cough etc.) before then, please call [phone].  If you test positive for Covid 19 in the 2 weeks post procedure, please call and report this information to [place].    If any biopsies were taken you will be contacted by phone or by letter within the next 1-3 weeks.  Please call us at [phone] if you have not heard about the biopsies in 3 weeks.    SIGNATURES/CONFIDENTIALITY: You and/or your care partner have signed paperwork which will be entered into your electronic medical record.  These signatures attest to the fact that that the information above on your After Visit Summary has been reviewed and is understood.  Full responsibility of the confidentiality of this discharge information lies with you and/or your care-partner.

## 2019-03-11 NOTE — Progress Notes (Signed)
Temp TM  VS  KA  Pt's states no medical or surgical changes since previsit or office visit.  Admitting RN reviewed

## 2019-03-11 NOTE — Op Note (Signed)
[facility] Patient Name: Marcayla Burkey Procedure Date: 03/11/2019 3:42 PM MRN: [id number] Endoscopist: Docia Chuck. Henrene Pastor , MD Age: 69 Referring MD:  Date of Birth: 1950/07/10 Gender: Female Account #: [id number] Procedure:                Colonoscopy with cold snare polypectomy x 5 Indications:              High risk colon cancer surveillance: Personal                            history of adenoma (10 mm or greater in size), High                            risk colon cancer surveillance: Personal history of                            multiple (3 or more) adenomas, High risk colon                            cancer surveillance: Personal history of colon                            cancer (malignant polyp with low risk features).                            Examination September 2020. Previous polypectomy                            site of malignant polyp was tattooed. Now for                            follow-up Medicines:                Monitored Anesthesia Care Procedure:                Pre-Anesthesia Assessment:                           - Prior to the procedure, a History and Physical                            was performed, and patient medications and                            allergies were reviewed. The patient's tolerance of                            previous anesthesia was also reviewed. The risks                            and benefits of the procedure and the sedation                            options and risks were discussed with the patient.  All questions were answered, and informed consent                            was obtained. Prior Anticoagulants: The patient has                            taken no previous anticoagulant or antiplatelet                            agents. ASA Grade Assessment: II - A patient with                            mild systemic disease. After reviewing the risks                            and benefits, the  patient was deemed in                            satisfactory condition to undergo the procedure.                           After obtaining informed consent, the colonoscope                            was passed under direct vision. Throughout the                            procedure, the patient's blood pressure, pulse, and                            oxygen saturations were monitored continuously. The                            Colonoscope was introduced through the anus and                            advanced to the the cecum, identified by                            appendiceal orifice and ileocecal valve. The                            ileocecal valve, appendiceal orifice, and rectum                            were photographed. The quality of the bowel                            preparation was excellent. The colonoscopy was                            performed without difficulty. The patient tolerated  the procedure well. The bowel preparation used was                            SUPREP via split dose instruction. Scope In: 3:53:34 PM Scope Out: [id number] PM Scope Withdrawal Time: 0 hours 12 minutes 40 seconds  Total Procedure Duration: 0 hours 22 minutes 37 seconds  Findings:                 Five polyps were found in the descending colon,                            transverse colon and ascending colon. The polyps                            were 4 to 6 mm in size. These polyps were removed                            with a cold snare. Resection and retrieval were                            complete.                           Multiple diverticula were found in the left colon.                           The previous area of polypectomy was easily                            identified in the rectosigmoid region by the                            marking tattoo and scar. No residual neoplasia. The                            exam was otherwise without abnormality on  direct                            and retroflexion views. Complications:            No immediate complications. Estimated blood loss:                            None. Estimated Blood Loss:     Estimated blood loss: none. Impression:               - Five 4 to 6 mm polyps in the descending colon, in                            the transverse colon and in the ascending colon,                            removed with a cold snare. Resected and retrieved.                           -  Diverticulosis in the left colon.                           -The area of previous polypectomy revealed the                            marking tattoo. There was also a scar. No evidence                            of residual polyp or cancer. The examination was                            otherwise normal on direct and retroflexion views. Recommendation:           - Repeat colonoscopy in 1 year for surveillance.                           - Patient has a contact number available for                            emergencies. The signs and symptoms of potential                            delayed complications were discussed with the                            patient. Return to normal activities tomorrow.                            Written discharge instructions were provided to the                            patient.                           - Resume previous diet.                           - Continue present medications.                           - Await pathology results. Docia Chuck. Henrene Pastor, MD 03/11/2019 4:26:02 PM This report has been signed electronically.

## 2019-03-11 NOTE — Progress Notes (Signed)
PT taken to PACU. Monitors in place. VSS. Report given to RN. 

## 2019-03-15 ENCOUNTER — Telehealth: Payer: Self-pay

## 2019-03-15 NOTE — Telephone Encounter (Signed)
  Follow up Call-  Call back number 03/11/2019 11/09/2018  Post procedure Call Back phone  # [phone]  Permission to leave phone message Yes Yes  Some recent data might be hidden     Patient questions:  Do you have a fever, pain , or abdominal swelling? No. Pain Score  0 *  Have you tolerated food without any problems? Yes.    Have you been able to return to your normal activities? Yes.    Do you have any questions about your discharge instructions: Diet   No. Medications  No. Follow up visit  No.  Do you have questions or concerns about your Care? No.  Actions: * If pain score is 4 or above: No action needed, pain <4.   1. Have you developed a fever since your procedure? No  2.   Have you had an respiratory symptoms (SOB or cough) since your procedure? No  3.   Have you tested positive for COVID 19 since your procedure No  4.   Have you had any family members/close contacts diagnosed with the COVID 19 since your procedure?  No   If yes to any of these questions please route to Joylene John, RN and Alphonsa Gin, RN.

## 2019-03-16 ENCOUNTER — Encounter: Payer: Self-pay | Admitting: Internal Medicine

## 2019-12-28 ENCOUNTER — Other Ambulatory Visit: Payer: Self-pay

## 2019-12-28 DIAGNOSIS — I714 Abdominal aortic aneurysm, without rupture, unspecified: Secondary | ICD-10-CM

## 2020-01-10 ENCOUNTER — Other Ambulatory Visit: Payer: Self-pay

## 2020-01-10 ENCOUNTER — Ambulatory Visit (HOSPITAL_COMMUNITY)
Admission: RE | Admit: 2020-01-10 | Discharge: 2020-01-10 | Disposition: A | Discharge status: Home / Self Care | Payer: Medicare HMO | Source: Ambulatory Visit | Attending: Physician Assistant | Admitting: Physician Assistant

## 2020-01-10 ENCOUNTER — Ambulatory Visit: Payer: Medicare HMO | Admitting: Physician Assistant

## 2020-01-10 VITALS — BP 154/92 | HR 72 | Temp 98.4°F | Ht 66.0 in | Wt 147.0 lb

## 2020-01-10 DIAGNOSIS — I714 Abdominal aortic aneurysm, without rupture, unspecified: Secondary | ICD-10-CM

## 2020-01-10 DIAGNOSIS — Z95828 Presence of other vascular implants and grafts: Secondary | ICD-10-CM

## 2020-01-10 NOTE — Progress Notes (Signed)
Office Note     CC:  follow up Requesting Provider:  Berkley Harvey, NP  HPI: Gloria Morrison is a 69 y.o. ([birth year]) female who presents for routine follow-up status post endovascular repair of 7 cm aortic aneurysm on July 19, 2014 by Dr. Oneida Alar.  She is doing well without complaints of intermittent significant abdominal or back pain.  No claudication or rest pain.  She is compliant with with her aspirin.  She declines statin use. She is not diabetic. She smokes approximately 6 cigarettes/day.  Past Medical History:  Diagnosis Date  . AAA (abdominal aortic aneurysm) without rupture ([place]) 07/06/2014  . Allergy    Per pt  . Complication of anesthesia    hard to wake up (pt does not want Versed)  . Cough    allergies  . Essential hypertension    takes Lisinopril daily  . GERD (gastroesophageal reflux disease)    once in a while-will take OTC meds if needed  . Headache    occasionally  . History of bronchitis    a yr ago  . Hyperlipidemia    borderline;was on meds but has been off x 12 yrs   . Insomnia    takes Lorazepam as needed  . Microscopic hematuria   . SBO (small bowel obstruction) ([place]) 07/2018  . URI (upper respiratory infection)    Jan 2016    Past Surgical History:  Procedure Laterality Date  . ABDOMINAL AORTIC ENDOVASCULAR STENT GRAFT N/A 07/19/2014   Procedure: ABDOMINAL AORTIC ENDOVASCULAR STENT GRAFT;  Surgeon: Elam Dutch, MD;  Location: [facility];  Service: Vascular;  Laterality: N/A;  . BREAST FIBROADENOMA SURGERY Left 1986   lumpectomy  . COLONOSCOPY    . TONSILLECTOMY  as a child    Social History   Socioeconomic History  . Marital status: Married    Spouse name: Not on file  . Number of children: Not on file  . Years of education: Not on file  . Highest education level: Not on file  Occupational History  . Not on file  Tobacco Use  . Smoking status: Light Tobacco Smoker    Packs/day: 0.25    Types: Cigarettes    Start date: 02/18/1991     Last attempt to quit: 03/20/2016    Years since quitting: 3.8  . Smokeless tobacco: Never Used  . Tobacco comment: 6 cigarettes per day  Vaping Use  . Vaping Use: Never used  Substance and Sexual Activity  . Alcohol use: Yes    Comment: 5-6 drinks per week  . Drug use: No  . Sexual activity: Yes    Birth control/protection: Post-menopausal  Other Topics Concern  . Not on file  Social History Narrative  . Not on file   Social Determinants of Health   Financial Resource Strain:   . Difficulty of Paying Living Expenses: Not on file  Food Insecurity:   . Worried About Charity fundraiser in the Last Year: Not on file  . Ran Out of Food in the Last Year: Not on file  Transportation Needs:   . Lack of Transportation (Medical): Not on file  . Lack of Transportation (Non-Medical): Not on file  Physical Activity:   . Days of Exercise per Week: Not on file  . Minutes of Exercise per Session: Not on file  Stress:   . Feeling of Stress : Not on file  Social Connections:   . Frequency of Communication with Friends and Family: Not on  file  . Frequency of Social Gatherings with Friends and Family: Not on file  . Attends Religious Services: Not on file  . Active Member of Clubs or Organizations: Not on file  . Attends Archivist Meetings: Not on file  . Marital Status: Not on file  Intimate Partner Violence:   . Fear of Current or Ex-Partner: Not on file  . Emotionally Abused: Not on file  . Physically Abused: Not on file  . Sexually Abused: Not on file   Family History  Problem Relation Age of Onset  . Melanoma Father   . Hypertension Sister   . Crohn's disease Brother   . Colon cancer Neg Hx   . Esophageal cancer Neg Hx   . Stomach cancer Neg Hx   . Rectal cancer Neg Hx     Current Outpatient Medications  Medication Sig Dispense Refill  . amLODipine (NORVASC) 5 MG tablet Take 1 tablet by mouth daily.    Marland Kitchen aspirin EC 81 MG tablet Take 81 mg by mouth daily.     .  calcium carbonate 1250 MG capsule Take 1,250 mg by mouth.    . Coenzyme Q10 (CO Q 10) 10 MG CAPS Take 100 mg by mouth.    . estradiol (ESTRACE) 0.1 MG/GM vaginal cream Place 1 Applicatorful vaginally once a week.     . hydrocortisone (ANUSOL-HC) 25 MG suppository Place 1 suppository (25 mg total) rectally at bedtime. (Patient not taking: Reported on 03/11/2019) 14 suppository 0  . LORazepam (ATIVAN) 1 MG tablet Take 1 mg by mouth as needed for anxiety.     . Multiple Vitamins-Minerals (ONE DAILY PLUS MINERALS) TABS Take 1 tablet by mouth.     . pantoprazole (PROTONIX) 40 MG tablet Take 40 mg by mouth daily.    . potassium chloride SA (K-DUR) 20 MEQ tablet Take 2 tablets (40 mEq total) by mouth daily for 3 days. 6 tablet 0   Current Facility-Administered Medications  Medication Dose Route Frequency Provider Last Rate Last Admin  . 0.9 %  sodium chloride infusion  500 mL Intravenous Once Irene Shipper, MD        Allergies  Allergen Reactions  . Versed [Midazolam]   . Tramadol Diarrhea    Other reaction(s): GI Upset (intolerance)     REVIEW OF SYSTEMS:   [X]  denotes positive finding, [ ]  denotes negative finding Cardiac  Comments:  Chest pain or chest pressure:    Shortness of breath upon exertion:    Short of breath when lying flat:    Irregular heart rhythm:        Vascular    Pain in calf, thigh, or hip brought on by ambulation:    Pain in feet at night that wakes you up from your sleep:     Blood clot in your veins:    Leg swelling:         Pulmonary    Oxygen at home:    Productive cough:     Wheezing:         Neurologic    Sudden weakness in arms or legs:     Sudden numbness in arms or legs:     Sudden onset of difficulty speaking or slurred speech:    Temporary loss of vision in one eye:     Problems with dizziness:         Gastrointestinal    Blood in stool:     Vomited blood:  Genitourinary    Burning when urinating:     Blood in urine:          Psychiatric    Major depression:         Hematologic    Bleeding problems:    Problems with blood clotting too easily:        Skin    Rashes or ulcers:        Constitutional    Fever or chills:      PHYSICAL EXAMINATION:  Vitals:   01/10/20 1001  BP: (!) 154/92  Pulse: 72  Temp: 98.4 F (36.9 C)  TempSrc: Skin  SpO2: 96%  Weight: 147 lb (66.7 kg)  Height: 5\' 6"  (1.676 m)    General:  WDWN in NAD; vital signs documented above Gait: Unaided HENT: WNL, normocephalic Pulmonary: normal non-labored breathing , without Rales, rhonchi,  wheezing Cardiac: regular HR, without  Murmurs without carotid bruit Abdomen: soft, NT, no masses Skin: without rashes Vascular Exam/Pulses: On the right, the patient's posterior tibial pulses 2+ femoral pulse 2+ radial pulse 2+ and brachial pulse 1+.  On the left she has a 2+ dorsalis pedis pulse 2+ femoral pulse 2+ brachial and radial pulses.  Popliteal pulses are nonpalpable. Extremities: without ischemic changes, without Gangrene , without cellulitis; without open wounds;  Musculoskeletal: no muscle wasting or atrophy  Neurologic: A&O X 3;  No focal weakness or paresthesias are detected Psychiatric:  The pt has Normal affect.   Non-Invasive Vascular Imaging:   01/10/2020 EVAR duplex: Summary:  Abdominal Aorta: Patent endovascular aneurysm repair with no evidence of  endoleak.  Maximal aortic diameter 3.19 cm   ASSESSMENT/PLAN:: 69 y.o. female here for follow up for status post EVAR for 7 cm abdominal aortic aneurysm.  Her aneurysmal sac continues to decrease in size. No evidence of endoleak. The patient underwent bilateral duplex examination of the popliteal arteries last year.  Mild dilatation of the proximal popliteal/distal superficial femoral artery on the left was observed.  There were no hemodynamic changes.  I discussed these findings with the patient and she desires reevaluation at her next visit. We will follow-up in 1 year  with aorta duplex as well as left distal SFA/popliteal artery duplex.  Advised her to call our office should she experience episodes of significant abdominal or back pain.  Continue aspirin.  Smoking cessation advised.    Barbie Banner, PA-C [facility] [phone]  Clinic MD:  Stanford Breed

## 2020-05-01 ENCOUNTER — Encounter: Payer: Self-pay | Admitting: Internal Medicine

## 2020-05-16 ENCOUNTER — Telehealth: Payer: Self-pay | Admitting: Internal Medicine

## 2020-05-16 NOTE — Telephone Encounter (Signed)
Returned call to patient, she states that she requested to speak with Magda Paganini. I told the patient that was not specified in the note that I received but I am one of the nurses and could assist her. She stated that she would prefer if Magda Paganini would call her to further discuss.

## 2020-05-17 NOTE — Telephone Encounter (Signed)
Spoke with patient who wanted to schedule her colonoscopy but also requested that Lennette Bihari be her CRNA.  She made this request last year as well because he is the only person she trusts to administer her sedation.  I told her I would put this in writing in the appointment notes for the [facility] and would also go upstairs the week of her procedure to confirm that he is available.  Patient agreed.  I told her I would leave her instructions and a sample of Sutab up front to be picked up.  Patient agreed.

## 2020-07-31 ENCOUNTER — Other Ambulatory Visit: Payer: Self-pay

## 2020-07-31 ENCOUNTER — Ambulatory Visit (AMBULATORY_SURGERY_CENTER): Payer: Medicare HMO | Admitting: Internal Medicine

## 2020-07-31 ENCOUNTER — Encounter: Payer: Self-pay | Admitting: Internal Medicine

## 2020-07-31 VITALS — BP 116/92 | HR 59 | Temp 97.4°F | Resp 16 | Ht 64.0 in | Wt 140.0 lb

## 2020-07-31 DIAGNOSIS — D123 Benign neoplasm of transverse colon: Secondary | ICD-10-CM

## 2020-07-31 DIAGNOSIS — D122 Benign neoplasm of ascending colon: Secondary | ICD-10-CM

## 2020-07-31 DIAGNOSIS — Z86010 Personal history of colonic polyps: Secondary | ICD-10-CM

## 2020-07-31 MED ORDER — SODIUM CHLORIDE 0.9 % IV SOLN: 500.0000 mL | INTRAVENOUS | Status: DC

## 2020-07-31 NOTE — Progress Notes (Signed)
PT taken to PACU. Monitors in place. VSS. Report given to RN. 

## 2020-07-31 NOTE — Patient Instructions (Signed)
Handouts given for polyps and diverticulosis.  YOU HAD AN ENDOSCOPIC PROCEDURE TODAY AT THE [facility]:   Refer to the procedure report that was given to you for any specific questions about what was found during the examination.  If the procedure report does not answer your questions, please call your gastroenterologist to clarify.  If you requested that your care partner not be given the details of your procedure findings, then the procedure report has been included in a sealed envelope for you to review at your convenience later.  YOU SHOULD EXPECT: Some feelings of bloating in the abdomen. Passage of more gas than usual.  Walking can help get rid of the air that was put into your GI tract during the procedure and reduce the bloating. If you had a lower endoscopy (such as a colonoscopy or flexible sigmoidoscopy) you may notice spotting of blood in your stool or on the toilet paper. If you underwent a bowel prep for your procedure, you may not have a normal bowel movement for a few days.  Please Note:  You might notice some irritation and congestion in your nose or some drainage.  This is from the oxygen used during your procedure.  There is no need for concern and it should clear up in a day or so.  SYMPTOMS TO REPORT IMMEDIATELY:  Following lower endoscopy (colonoscopy or flexible sigmoidoscopy):  Excessive amounts of blood in the stool  Significant tenderness or worsening of abdominal pains  Swelling of the abdomen that is new, acute  Fever of 100F or higher  For urgent or emergent issues, a gastroenterologist can be reached at any hour by calling [phone]. Do not use MyChart messaging for urgent concerns.    DIET:  We do recommend a small meal at first, but then you may proceed to your regular diet.  Drink plenty of fluids but you should avoid alcoholic beverages for 24 hours.  ACTIVITY:  You should plan to take it easy for the rest of today and you should NOT DRIVE  or use heavy machinery until tomorrow (because of the sedation medicines used during the test).    FOLLOW UP: Our staff will call the number listed on your records 48-72 hours following your procedure to check on you and address any questions or concerns that you may have regarding the information given to you following your procedure. If we do not reach you, we will leave a message.  We will attempt to reach you two times.  During this call, we will ask if you have developed any symptoms of COVID 19. If you develop any symptoms (ie: fever, flu-like symptoms, shortness of breath, cough etc.) before then, please call [phone].  If you test positive for Covid 19 in the 2 weeks post procedure, please call and report this information to us.    If any biopsies were taken you will be contacted by phone or by letter within the next 1-3 weeks.  Please call us at [phone] if you have not heard about the biopsies in 3 weeks.    SIGNATURES/CONFIDENTIALITY: You and/or your care partner have signed paperwork which will be entered into your electronic medical record.  These signatures attest to the fact that that the information above on your After Visit Summary has been reviewed and is understood.  Full responsibility of the confidentiality of this discharge information lies with you and/or your care-partner.  

## 2020-07-31 NOTE — Progress Notes (Signed)
Vitals-CW  Pt's states no medical or surgical changes since previsit or office visit. 

## 2020-07-31 NOTE — Op Note (Signed)
[facility] Patient Name: Gloria Morrison Procedure Date: 07/31/2020 8:48 AM MRN: [id number] Endoscopist: Docia Chuck. Henrene Pastor , MD Age: 70 Referring MD:  Date of Birth: May 26, 1950 Gender: Female Account #: [id number] Procedure:                Colonoscopy with cold snare polypectomy x 3 Indications:              High risk colon cancer surveillance: Personal                            history of adenoma (10 mm or greater in size), High                            risk colon cancer surveillance: Personal history of                            multiple (3 or more) adenomas, High risk colon                            cancer surveillance: Personal history of colon                            cancer (index exam September 2020 with malignant                            polyp removed); surveillance examination in January                            2021 Medicines:                Monitored Anesthesia Care Procedure:                Pre-Anesthesia Assessment:                           - Prior to the procedure, a History and Physical                            was performed, and patient medications and                            allergies were reviewed. The patient's tolerance of                            previous anesthesia was also reviewed. The risks                            and benefits of the procedure and the sedation                            options and risks were discussed with the patient.                            All questions were answered, and informed consent  was obtained. Prior Anticoagulants: The patient has                            taken no previous anticoagulant or antiplatelet                            agents. ASA Grade Assessment: II - A patient with                            mild systemic disease. After reviewing the risks                            and benefits, the patient was deemed in                            satisfactory condition to  undergo the procedure.                           After obtaining informed consent, the colonoscope                            was passed under direct vision. Throughout the                            procedure, the patient's blood pressure, pulse, and                            oxygen saturations were monitored continuously. The                            Olympus [id number]) Colonoscope was                            introduced through the anus and advanced to the the                            cecum, identified by appendiceal orifice and                            ileocecal valve. The ileocecal valve, appendiceal                            orifice, and rectum were photographed. The quality                            of the bowel preparation was excellent. The                            colonoscopy was performed without difficulty. The                            patient tolerated the procedure well. The bowel  preparation used was SUPREP via split dose                            instruction. Scope In: 9:03:15 AM Scope Out: 9:24:15 AM Scope Withdrawal Time: 0 hours 13 minutes 55 seconds  Total Procedure Duration: 0 hours 21 minutes 0 seconds  Findings:                 Three polyps were found in the transverse colon and                            ascending colon. The polyps were 2 to 4 mm in size.                            These polyps were removed with a cold snare.                            Resection and retrieval were complete.                           Scattered diverticula were found in the left colon.                            The previously placed marking tattoo in the rectum                            was easily identified. No residual neoplasia.                           The exam was otherwise without abnormality on                            direct and retroflexed views. The colon was                            redundant Complications:             No immediate complications. Estimated blood loss:                            None. Estimated Blood Loss:     Estimated blood loss: none. Impression:               - Three 2 to 4 mm polyps in the transverse colon                            and in the ascending colon, removed with a cold                            snare. Resected and retrieved.                           - Diverticulosis in the left colon.                           -  The examination was otherwise normal on direct                            and retroflexion views. Marking tattoo identified                            without recurrent neoplasia.                           - REDUNDANT COLON Recommendation:           - Repeat colonoscopy in 3 years for surveillance.                           - Patient has a contact number available for                            emergencies. The signs and symptoms of potential                            delayed complications were discussed with the                            patient. Return to normal activities tomorrow.                            Written discharge instructions were provided to the                            patient.                           - Resume previous diet.                           - Continue present medications.                           - Await pathology results. Docia Chuck. Henrene Pastor, MD 07/31/2020 9:35:12 AM This report has been signed electronically.

## 2020-08-02 ENCOUNTER — Telehealth: Payer: Self-pay

## 2020-08-02 NOTE — Telephone Encounter (Signed)
  Follow up Call-  Call back number 07/31/2020 03/11/2019 11/09/2018  Post procedure Call Back phone  # [phone]  Permission to leave phone message Yes Yes Yes  comments ? voice mail - -  Some recent data might be hidden     Patient questions:  Do you have a fever, pain , or abdominal swelling? No. Pain Score  0 *  Have you tolerated food without any problems? Yes.    Have you been able to return to your normal activities? Yes.    Do you have any questions about your discharge instructions: Diet   No. Medications  No. Follow up visit  No.  Do you have questions or concerns about your Care? No.  Actions: * If pain score is 4 or above: No action needed, pain <4.  Have you developed a fever since your procedure? no  2.   Have you had an respiratory symptoms (SOB or cough) since your procedure? no  3.   Have you tested positive for COVID 19 since your procedure no  4.   Have you had any family members/close contacts diagnosed with the COVID 19 since your procedure?  no   If yes to any of these questions please route to Joylene John, RN and Joella Prince, RN

## 2020-08-08 ENCOUNTER — Encounter: Payer: Self-pay | Admitting: Internal Medicine

## 2021-01-23 ENCOUNTER — Other Ambulatory Visit: Payer: Self-pay

## 2021-01-23 DIAGNOSIS — I714 Abdominal aortic aneurysm, without rupture, unspecified: Secondary | ICD-10-CM

## 2021-01-23 DIAGNOSIS — I739 Peripheral vascular disease, unspecified: Secondary | ICD-10-CM

## 2021-02-08 ENCOUNTER — Other Ambulatory Visit (HOSPITAL_COMMUNITY): Payer: Medicare HMO

## 2021-02-08 ENCOUNTER — Encounter (HOSPITAL_COMMUNITY): Payer: Medicare HMO

## 2021-02-08 ENCOUNTER — Ambulatory Visit: Payer: Medicare HMO | Admitting: Vascular Surgery

## 2021-03-13 NOTE — Progress Notes (Signed)
Office Note     CC:  follow up Requesting Provider:  Berkley Harvey, NP  HPI: Gloria Morrison is a 71 y.o. (Jan 22, 1951) female who presents for routine follow-up status post endovascular repair of 7 cm aortic aneurysm on July 19, 2014 by Dr. Oneida Alar.    [place] native, she now lives in [place].  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  On exam, Gloria Morrison was doing well without complaints of intermittent significant abdominal or back pain.  No claudication or rest pain.  She smokes roughly 3 to 4 cigarettes daily.   She is compliant with with her aspirin.  She declines statin use. She is not diabetic. She smokes approximately 6 cigarettes/day.  Past Medical History:  Diagnosis Date   AAA (abdominal aortic aneurysm) without rupture ([place]) 07/06/2014   Allergy    Per pt   Complication of anesthesia    hard to wake up (pt does not want Versed)   Cough    allergies   Essential hypertension    takes Lisinopril daily   GERD (gastroesophageal reflux disease)    once in a while-will take OTC meds if needed   Headache    occasionally   History of bronchitis    a yr ago   Hyperlipidemia    borderline;was on meds but has been off x 12 yrs    Insomnia    takes Lorazepam as needed   Microscopic hematuria    SBO (small bowel obstruction) ([place]) 07/2018   URI (upper respiratory infection)    Jan 2016    Past Surgical History:  Procedure Laterality Date   ABDOMINAL AORTIC ENDOVASCULAR STENT GRAFT N/A 07/19/2014   Procedure: ABDOMINAL AORTIC ENDOVASCULAR STENT GRAFT;  Surgeon: Elam Dutch, MD;  Location: [facility] OR;  Service: Vascular;  Laterality: N/A;   BREAST FIBROADENOMA SURGERY Left 1986   lumpectomy   COLONOSCOPY     TONSILLECTOMY  as a child    Social History   Socioeconomic History   Marital status: Married    Spouse name: Not on file   Number of children: Not on file   Years of education: Not on file   Highest education level: Not on file  Occupational  History   Not on file  Tobacco Use   Smoking status: Light Smoker    Packs/day: 0.25    Types: Cigarettes    Start date: 02/18/1991    Last attempt to quit: 03/20/2016    Years since quitting: 4.9   Smokeless tobacco: Never   Tobacco comments:    6 cigarettes per day  Vaping Use   Vaping Use: Never used  Substance and Sexual Activity   Alcohol use: Yes    Comment: 5-6 drinks per week   Drug use: No   Sexual activity: Yes    Birth control/protection: Post-menopausal  Other Topics Concern   Not on file  Social History Narrative   Not on file   Social Determinants of Health   Financial Resource Strain: Not on file  Food Insecurity: Not on file  Transportation Needs: Not on file  Physical Activity: Not on file  Stress: Not on file  Social Connections: Not on file  Intimate Partner Violence: Not on file   Family History  Problem Relation Age of Onset   Melanoma Father    Hypertension Sister    Crohn's disease Brother    Colon cancer Neg Hx    Esophageal cancer Neg Hx    Stomach  cancer Neg Hx    Rectal cancer Neg Hx     Current Outpatient Medications  Medication Sig Dispense Refill   amLODipine (NORVASC) 5 MG tablet Take 1 tablet by mouth daily.     aspirin EC 81 MG tablet Take 81 mg by mouth daily.      calcium carbonate 1250 MG capsule Take 1,250 mg by mouth.     Coenzyme Q10 (CO Q 10) 10 MG CAPS Take 100 mg by mouth.     Cyanocobalamin (B-12) 5000 MCG SUBL      estradiol (ESTRACE) 0.1 MG/GM vaginal cream Place 1 Applicatorful vaginally once a week.      hydrocortisone (ANUSOL-HC) 25 MG suppository Place 1 suppository (25 mg total) rectally at bedtime. 14 suppository 0   LORazepam (ATIVAN) 1 MG tablet Take 1 mg by mouth as needed for anxiety.      Multiple Vitamins-Minerals (ONE DAILY PLUS MINERALS) TABS Take 1 tablet by mouth.      pantoprazole (PROTONIX) 40 MG tablet Take 40 mg by mouth daily.     potassium chloride SA (K-DUR) 20 MEQ tablet Take 2 tablets (40 mEq  total) by mouth daily for 3 days. 6 tablet 0   No current facility-administered medications for this visit.    Allergies  Allergen Reactions   Versed [Midazolam]    Tramadol Diarrhea    Other reaction(s): GI Upset (intolerance)     REVIEW OF SYSTEMS:   [X]  denotes positive finding, [ ]  denotes negative finding Cardiac  Comments:  Chest pain or chest pressure:    Shortness of breath upon exertion:    Short of breath when lying flat:    Irregular heart rhythm:        Vascular    Pain in calf, thigh, or hip brought on by ambulation:    Pain in feet at night that wakes you up from your sleep:     Blood clot in your veins:    Leg swelling:         Pulmonary    Oxygen at home:    Productive cough:     Wheezing:         Neurologic    Sudden weakness in arms or legs:     Sudden numbness in arms or legs:     Sudden onset of difficulty speaking or slurred speech:    Temporary loss of vision in one eye:     Problems with dizziness:         Gastrointestinal    Blood in stool:     Vomited blood:         Genitourinary    Burning when urinating:     Blood in urine:        Psychiatric    Major depression:         Hematologic    Bleeding problems:    Problems with blood clotting too easily:        Skin    Rashes or ulcers:        Constitutional    Fever or chills:      PHYSICAL EXAMINATION:  There were no vitals filed for this visit.   General:  WDWN in NAD; vital signs documented above Gait: Unaided HENT: WNL, normocephalic Pulmonary: normal non-labored breathing , without Rales, rhonchi,  wheezing Cardiac: regular HR, without  Murmurs without carotid bruit Abdomen: soft, NT, no masses Skin: without rashes Vascular Exam/Pulses: On the right, the patient's posterior tibial pulses 2+ femoral  pulse 2+ radial pulse 2+ and brachial pulse 1+.  On the left she has a 2+ dorsalis pedis pulse 2+ femoral pulse 2+ brachial and radial pulses.  Popliteal pulses are  nonpalpable. Extremities: without ischemic changes, without Gangrene , without cellulitis; without open wounds;  Musculoskeletal: no muscle wasting or atrophy  Neurologic: A&O X 3;  No focal weakness or paresthesias are detected Psychiatric:  The pt has Normal affect.   Non-Invasive Vascular Imaging:   01/10/2020 EVAR duplex:  Abdominal Aorta: Patent endovascular aneurysm repair with no evidence of  endoleak. The largest aortic diameter remains essentially unchanged  compared to prior exam. Previous diameter measurement was 3.2 cm obtained  on 01/10/2020.   Endovascular Aortic Repair (EVAR):  +----------+----------------+-------------------+-------------------+              Diameter AP (cm) Diameter Trans (cm) Velocities (cm/sec)   +----------+----------------+-------------------+-------------------+   Aorta      3.22             3.26                60                    +----------+----------------+-------------------+-------------------+   Right Limb 1.69             1.58                60                    +----------+----------------+-------------------+-------------------+   Left Limb  1.61             1.45                40                    +----------+----------------+-------------------+-------------------+     ASSESSMENT/PLAN:: 72 y.o. female here for follow up for status post EVAR for 7 cm abdominal aortic aneurysm.  Her aneurysmal sac size is stable.  No evidence of endoleak. The patient underwent bilateral duplex examination of the popliteal arteries in 2021.  Mild dilatation of the proximal popliteal/distal superficial femoral artery on the left was observed.  This is relatively unchanged on ultrasound today.  We will follow-up in 1 year with aorta duplex as well as left distal SFA/popliteal artery duplex.  Advised her to call our office should she experience episodes of significant abdominal or back pain.  Continue aspirin.  Smoking cessation advised.    Broadus John,  [facility] [phone]

## 2021-03-15 ENCOUNTER — Encounter: Payer: Self-pay | Admitting: Vascular Surgery

## 2021-03-15 ENCOUNTER — Other Ambulatory Visit: Payer: Self-pay

## 2021-03-15 ENCOUNTER — Ambulatory Visit (INDEPENDENT_AMBULATORY_CARE_PROVIDER_SITE_OTHER)
Admission: RE | Admit: 2021-03-15 | Discharge: 2021-03-15 | Disposition: A | Discharge status: Home / Self Care | Payer: Medicare HMO | Source: Ambulatory Visit | Attending: Vascular Surgery | Admitting: Vascular Surgery

## 2021-03-15 ENCOUNTER — Ambulatory Visit: Payer: Medicare HMO | Admitting: Vascular Surgery

## 2021-03-15 ENCOUNTER — Encounter (HOSPITAL_COMMUNITY): Payer: Medicare HMO

## 2021-03-15 ENCOUNTER — Ambulatory Visit (HOSPITAL_COMMUNITY)
Admission: RE | Admit: 2021-03-15 | Discharge: 2021-03-15 | Disposition: A | Discharge status: Home / Self Care | Payer: Medicare HMO | Source: Ambulatory Visit | Attending: Vascular Surgery | Admitting: Vascular Surgery

## 2021-03-15 VITALS — BP 137/93 | HR 62 | Temp 97.9°F | Resp 20 | Ht 64.0 in | Wt 144.0 lb

## 2021-03-15 DIAGNOSIS — I714 Abdominal aortic aneurysm, without rupture, unspecified: Secondary | ICD-10-CM | POA: Diagnosis present

## 2021-03-15 DIAGNOSIS — I739 Peripheral vascular disease, unspecified: Secondary | ICD-10-CM | POA: Diagnosis present

## 2021-03-15 DIAGNOSIS — Z95828 Presence of other vascular implants and grafts: Secondary | ICD-10-CM | POA: Diagnosis not present

## 2022-03-11 ENCOUNTER — Other Ambulatory Visit: Payer: Self-pay | Admitting: *Deleted

## 2022-03-11 DIAGNOSIS — Z95828 Presence of other vascular implants and grafts: Secondary | ICD-10-CM

## 2022-03-11 DIAGNOSIS — M79603 Pain in arm, unspecified: Secondary | ICD-10-CM

## 2022-03-21 ENCOUNTER — Ambulatory Visit (HOSPITAL_COMMUNITY)
Admission: RE | Admit: 2022-03-21 | Discharge: 2022-03-21 | Disposition: A | Discharge status: Home / Self Care | Payer: Medicare HMO | Source: Ambulatory Visit | Attending: Vascular Surgery | Admitting: Vascular Surgery

## 2022-03-21 ENCOUNTER — Ambulatory Visit: Payer: Medicare HMO | Admitting: Physician Assistant

## 2022-03-21 ENCOUNTER — Ambulatory Visit (INDEPENDENT_AMBULATORY_CARE_PROVIDER_SITE_OTHER)
Admission: RE | Admit: 2022-03-21 | Discharge: 2022-03-21 | Disposition: A | Discharge status: Home / Self Care | Payer: Medicare HMO | Source: Ambulatory Visit | Attending: Vascular Surgery | Admitting: Vascular Surgery

## 2022-03-21 VITALS — BP 137/88 | HR 61 | Temp 97.4°F | Resp 20 | Ht 64.0 in | Wt 141.2 lb

## 2022-03-21 DIAGNOSIS — M79603 Pain in arm, unspecified: Secondary | ICD-10-CM | POA: Insufficient documentation

## 2022-03-21 DIAGNOSIS — Z95828 Presence of other vascular implants and grafts: Secondary | ICD-10-CM | POA: Diagnosis not present

## 2022-03-21 NOTE — Progress Notes (Signed)
[facility] [place] HISTORY AND PHYSICAL   History of Present Illness:  Patient is a 72 y.o. year old female who presents for evaluation of abdominal aortic aneurysm s/p EVAR by DR. Fields on 07/19/14.  She has also been followed for a left popliteal aneurysm with a last measurement of 0.88 cm 1 year ago.    She denise lumbar or abdominal pain out of the ordinary.  She denise claudication, rest pain or non healing wounds.  She is here for repeat studies and surveillance of the above issues.   She a on a daily ASA, she is no longer on a Stain.  She smokes about 4 cigarettes daily.  She states she stays active.         Past Medical History:  Diagnosis Date   AAA (abdominal aortic aneurysm) without rupture 07/06/2014   Allergy    Per pt   Complication of anesthesia    hard to wake up (pt does not want Versed)   Cough    allergies   Essential hypertension    takes Lisinopril daily   GERD (gastroesophageal reflux disease)    once in a while-will take OTC meds if needed   Headache    occasionally   History of bronchitis    a yr ago   Hyperlipidemia    borderline;was on meds but has been off x 12 yrs    Insomnia    takes Lorazepam as needed   Microscopic hematuria    SBO (small bowel obstruction) ([place]) 07/2018   URI (upper respiratory infection)    Jan 2016    Past Surgical History:  Procedure Laterality Date   ABDOMINAL AORTIC ENDOVASCULAR STENT GRAFT N/A 07/19/2014   Procedure: ABDOMINAL AORTIC ENDOVASCULAR STENT GRAFT;  Surgeon: Elam Dutch, MD;  Location: [facility] OR;  Service: Vascular;  Laterality: N/A;   BREAST FIBROADENOMA SURGERY Left 1986   lumpectomy   COLONOSCOPY     TONSILLECTOMY  as a child     Social History Social History   Tobacco Use   Smoking status: Light Smoker    Packs/day: 0.25    Types: Cigarettes    Start date: 02/18/1991    Last attempt to quit: 03/20/2016    Years since quitting: 6.0   Smokeless tobacco: Never   Tobacco  comments:    6 cigarettes per day  Vaping Use   Vaping Use: Never used  Substance Use Topics   Alcohol use: Yes    Comment: 5-6 drinks per week   Drug use: No    Family History Family History  Problem Relation Age of Onset   Melanoma Father    Hypertension Sister    Crohn's disease Brother    Colon cancer Neg Hx    Esophageal cancer Neg Hx    Stomach cancer Neg Hx    Rectal cancer Neg Hx     Allergies  Allergies  Allergen Reactions   Versed [Midazolam]    Tramadol Diarrhea    Other reaction(s): GI Upset (intolerance)   Nitrofurantoin     Other reaction(s): Arthralgias (intolerance) Will never have this med again     Current Outpatient Medications  Medication Sig Dispense Refill   amLODipine (NORVASC) 5 MG tablet Take 1 tablet by mouth daily.     aspirin EC 81 MG tablet Take 81 mg by mouth daily.      calcium carbonate 1250 MG capsule Take 1,250 mg by mouth.     Coenzyme Q10 (CO  Q 10) 10 MG CAPS Take 100 mg by mouth.     Cyanocobalamin (B-12) 5000 MCG SUBL      estradiol (ESTRACE) 0.1 MG/GM vaginal cream Place 1 Applicatorful vaginally once a week.      hydrocortisone (ANUSOL-HC) 25 MG suppository Place 1 suppository (25 mg total) rectally at bedtime. 14 suppository 0   LORazepam (ATIVAN) 1 MG tablet Take 1 mg by mouth as needed for anxiety.      Multiple Vitamins-Minerals (ONE DAILY PLUS MINERALS) TABS Take 1 tablet by mouth.      pantoprazole (PROTONIX) 40 MG tablet Take 40 mg by mouth daily.     potassium chloride SA (K-DUR) 20 MEQ tablet Take 2 tablets (40 mEq total) by mouth daily for 3 days. 6 tablet 0   No current facility-administered medications for this visit.    ROS:   General:  No weight loss, Fever, chills  HEENT: No recent headaches, no nasal bleeding, no visual changes, no sore throat  Neurologic: No dizziness, blackouts, seizures. No recent symptoms of stroke or mini- stroke. No recent episodes of slurred speech, or temporary  blindness.  Cardiac: No recent episodes of chest pain/pressure, no shortness of breath at rest.  No shortness of breath with exertion.  Denies history of atrial fibrillation or irregular heartbeat  Vascular: No history of rest pain in feet.  No history of claudication.  No history of non-healing ulcer, No history of DVT   Pulmonary: No home oxygen, no productive cough, no hemoptysis,  No asthma or wheezing  Musculoskeletal:  '[ ]'$  Arthritis, '[ ]'$  Low back pain,  '[ ]'$  Joint pain  Hematologic:No history of hypercoagulable state.  No history of easy bleeding.  No history of anemia  Gastrointestinal: No hematochezia or melena,  No gastroesophageal reflux, no trouble swallowing  Urinary: '[ ]'$  chronic Kidney disease, '[ ]'$  on HD - '[ ]'$  MWF or '[ ]'$  TTHS, '[ ]'$  Burning with urination, '[ ]'$  Frequent urination, '[ ]'$  Difficulty urinating;   Skin: No rashes  Psychological: No history of anxiety,  No history of depression   Physical Examination  There were no vitals filed for this visit.  There is no height or weight on file to calculate BMI.  General:  Alert and oriented, no acute distress HEENT: Normal Neck: No bruit or JVD Pulmonary: Clear to auscultation bilaterally Cardiac: Regular Rate and Rhythm without murmur Gastrointestinal: Soft, non-tender, non-distended, no mass, no scars Skin: No rash Extremity Pulses:  2+ radial,femoral, dorsalis pedis,  pulses bilaterally Musculoskeletal: No deformity or edema  Neurologic: Upper and lower extremity motor 5/5 and symmetric  DATA:  Endovascular Aortic Repair (EVAR):  +----------+----------------+-------------------+-------------------+           Diameter AP (cm)Diameter Trans (cm)Velocities (cm/sec)  +----------+----------------+-------------------+-------------------+  Aorta    3.28            3.21               68                   +----------+----------------+-------------------+-------------------+  Right Limb1.63            1.67                37                   +----------+----------------+-------------------+-------------------+  Left Limb 1.45            1.42  55                   +----------+----------------+-------------------+-------------------+        Summary:  Abdominal Aorta: Patent endovascular aneurysm repair with no evidence of  endoleak. The largest aortic diameter remains essentially unchanged  compared to prior exam. Previous diameter measurement was 3.3 cm obtained  on 03/15/2021.      +--------------+-------+-----------+---------+--------+-----+--------+  Left PoplitealAP (cm)Transv (cm)Waveform StenosisShapeComments  +--------------+-------+-----------+---------+--------+-----+--------+  Proximal     0.91   0.93       triphasic                       +--------------+-------+-----------+---------+--------+-----+--------+  Mid          0.65   0.62       biphasic                        +--------------+-------+-----------+---------+--------+-----+--------+  Distal       0.61   0.66       biphasic                        +--------------+-------+-----------+---------+--------+-----+--------+     Summary:  Left: Mild dilatation of the proximal popliteal artery.   ASSESSMENT/PLAN: Patient is a 71 y.o. year old female who presents for evaluation of abdominal aortic aneurysm s/p EVAR by DR. Fields on 07/19/14.  She has also been followed for a left popliteal aneurysm.  The AAA duplex shows unchanged with a stable diameter of 3.28.  She remains asymptomatic for abdominal.  Left: Mild dilatation of the proximal popliteal artery. Largest 0.93.  She has palpable pedal pulses and no history of LE ischemic changes.   If the popliteal aneurysm is greater than 1.5 cm we will suggest intervention.  She will f/u in 1 year for repeat surveillance.        Roxy Horseman PA-C [facility] [place] Office: [phone]  MD in  clinic [place]

## 2023-03-19 ENCOUNTER — Other Ambulatory Visit: Payer: Self-pay | Admitting: *Deleted

## 2023-03-19 DIAGNOSIS — Z95828 Presence of other vascular implants and grafts: Secondary | ICD-10-CM

## 2023-03-20 NOTE — Progress Notes (Signed)
Fax received from [facility] on 03/11/23 for medical clearance/medication hold for left lower eyelid lesion excision and repair with biopsy to be signed by B. Randie Heinz, MD.  Provider signed on 03/15/23, scanned into pt's chart, faxed back to sender on 03/18/23.

## 2023-03-31 NOTE — Progress Notes (Deleted)
 [facility] [place] HISTORY AND PHYSICAL   History of Present Illness:  Patient is a 73 y.o. year old female who presents for evaluation of abdominal aortic aneurysm s/p EVAR by DR. Fields on 07/19/14.  She has also been followed for a left popliteal aneurysm with a last measurement of 0.93 cm 1 year ago.    She denies lumbar or abdominal pain out of the ordinary.  She denise claudication, rest pain or non healing wounds.  She is here for repeat studies and surveillance of the above issues.   She a on a daily ASA, she is no longer on a Stain.  She smokes about 4 cigarettes daily.  She states she stays active.         Past Medical History:  Diagnosis Date   AAA (abdominal aortic aneurysm) without rupture ([facility]) 07/06/2014   Allergy    Per pt   Complication of anesthesia    hard to wake up (pt does not want Versed)   Cough    allergies   Essential hypertension    takes Lisinopril daily   GERD (gastroesophageal reflux disease)    once in a while-will take OTC meds if needed   Headache    occasionally   History of bronchitis    a yr ago   Hyperlipidemia    borderline;was on meds but has been off x 12 yrs    Insomnia    takes Lorazepam as needed   Microscopic hematuria    SBO (small bowel obstruction) ([facility]) 07/2018   URI (upper respiratory infection)    Jan 2016    Past Surgical History:  Procedure Laterality Date   ABDOMINAL AORTIC ENDOVASCULAR STENT GRAFT N/A 07/19/2014   Procedure: ABDOMINAL AORTIC ENDOVASCULAR STENT GRAFT;  Surgeon: Sherren Kerns, MD;  Location: [facility] OR;  Service: Vascular;  Laterality: N/A;   BREAST FIBROADENOMA SURGERY Left 1986   lumpectomy   COLONOSCOPY     TONSILLECTOMY  as a child     Social History Social History   Tobacco Use   Smoking status: Light Smoker    Current packs/day: 0.00    Average packs/day: 0.3 packs/day for 25.1 years (6.3 ttl pk-yrs)    Types: Cigarettes    Start date: 02/18/1991    Last attempt to quit:  03/20/2016    Years since quitting: 7.0    Passive exposure: Never   Smokeless tobacco: Never   Tobacco comments:    6 cigarettes per day  Vaping Use   Vaping status: Never Used  Substance Use Topics   Alcohol use: Yes    Comment: 5-6 drinks per week   Drug use: No    Family History Family History  Problem Relation Age of Onset   Melanoma Father    Hypertension Sister    Crohn's disease Brother    Colon cancer Neg Hx    Esophageal cancer Neg Hx    Stomach cancer Neg Hx    Rectal cancer Neg Hx     Allergies  Allergies  Allergen Reactions   Versed [Midazolam]    Tramadol Diarrhea    Other reaction(s): GI Upset (intolerance)   Nitrofurantoin     Other reaction(s): Arthralgias (intolerance) Will never have this med again     Current Outpatient Medications  Medication Sig Dispense Refill   amLODipine (NORVASC) 5 MG tablet Take 1 tablet by mouth daily.     aspirin EC 81 MG tablet Take 81 mg by mouth daily.  calcium carbonate 1250 MG capsule Take 1,250 mg by mouth.     Coenzyme Q10 (CO Q 10) 10 MG CAPS Take 100 mg by mouth.     Cyanocobalamin (B-12) 5000 MCG SUBL      estradiol (ESTRACE) 0.1 MG/GM vaginal cream Place 1 Applicatorful vaginally once a week.      hydrocortisone (ANUSOL-HC) 25 MG suppository Place 1 suppository (25 mg total) rectally at bedtime. 14 suppository 0   LORazepam (ATIVAN) 1 MG tablet Take 1 mg by mouth as needed for anxiety.      Multiple Vitamins-Minerals (ONE DAILY PLUS MINERALS) TABS Take 1 tablet by mouth.      pantoprazole (PROTONIX) 40 MG tablet Take 40 mg by mouth daily.     potassium chloride SA (K-DUR) 20 MEQ tablet Take 2 tablets (40 mEq total) by mouth daily for 3 days. 6 tablet 0   No current facility-administered medications for this visit.    ROS:   General:  No weight loss, Fever, chills  HEENT: No recent headaches, no nasal bleeding, no visual changes, no sore throat  Neurologic: No dizziness, blackouts, seizures. No  recent symptoms of stroke or mini- stroke. No recent episodes of slurred speech, or temporary blindness.  Cardiac: No recent episodes of chest pain/pressure, no shortness of breath at rest.  No shortness of breath with exertion.  Denies history of atrial fibrillation or irregular heartbeat  Vascular: No history of rest pain in feet.  No history of claudication.  No history of non-healing ulcer, No history of DVT   Pulmonary: No home oxygen, no productive cough, no hemoptysis,  No asthma or wheezing  Musculoskeletal:  [ ]  Arthritis, [ ]  Low back pain,  [ ]  Joint pain  Hematologic:No history of hypercoagulable state.  No history of easy bleeding.  No history of anemia  Gastrointestinal: No hematochezia or melena,  No gastroesophageal reflux, no trouble swallowing  Urinary: [ ]  chronic Kidney disease, [ ]  on HD - [ ]  MWF or [ ]  TTHS, [ ]  Burning with urination, [ ]  Frequent urination, [ ]  Difficulty urinating;   Skin: No rashes  Psychological: No history of anxiety,  No history of depression   Physical Examination  There were no vitals filed for this visit.  There is no height or weight on file to calculate BMI.  General:  Alert and oriented, no acute distress HEENT: Normal Neck: No bruit or JVD Pulmonary: Clear to auscultation bilaterally Cardiac: Regular Rate and Rhythm without murmur Gastrointestinal: Soft, non-tender, non-distended, no mass, no scars Skin: No rash Extremity Pulses:  2+ radial,femoral, dorsalis pedis,  pulses bilaterally Musculoskeletal: No deformity or edema  Neurologic: Upper and lower extremity motor 5/5 and symmetric  DATA:  Endovascular Aortic Repair (EVAR):  +----------+----------------+-------------------+-------------------+           Diameter AP (cm)Diameter Trans (cm)Velocities (cm/sec)  +----------+----------------+-------------------+-------------------+  Aorta    3.28            3.21               68                    +----------+----------------+-------------------+-------------------+  Right Limb1.63            1.67               37                   +----------+----------------+-------------------+-------------------+  Left Limb 1.45  1.42               45                   +----------+----------------+-------------------+-------------------+        Summary:  Abdominal Aorta: Patent endovascular aneurysm repair with no evidence of  endoleak. The largest aortic diameter remains essentially unchanged  compared to prior exam. Previous diameter measurement was 3.3 cm obtained  on 03/15/2021.      +--------------+-------+-----------+---------+--------+-----+--------+  Left PoplitealAP (cm)Transv (cm)Waveform StenosisShapeComments  +--------------+-------+-----------+---------+--------+-----+--------+  Proximal     0.91   0.93       triphasic                       +--------------+-------+-----------+---------+--------+-----+--------+  Mid          0.65   0.62       biphasic                        +--------------+-------+-----------+---------+--------+-----+--------+  Distal       0.61   0.66       biphasic                        +--------------+-------+-----------+---------+--------+-----+--------+     Summary:  Left: Mild dilatation of the proximal popliteal artery.   ASSESSMENT/PLAN: Patient is a 73 y.o. year old female who presents for evaluation of abdominal aortic aneurysm s/p EVAR by DR. Fields on 07/19/14.  She has also been followed for a left popliteal aneurysm.  The AAA duplex shows unchanged with a stable diameter of 3.28.  She remains asymptomatic for abdominal.  Left: Mild dilatation of the proximal popliteal artery. Largest 0.93.  She has palpable pedal pulses and no history of LE ischemic changes.   If the popliteal aneurysm is greater than 2 cm we will suggest intervention.  She will f/u in 1 year for repeat surveillance.         Victorino Sparrow, MD [facility] [place] Office: [phone]

## 2023-04-02 ENCOUNTER — Ambulatory Visit: Payer: Medicare HMO | Admitting: Vascular Surgery

## 2023-04-02 ENCOUNTER — Ambulatory Visit (INDEPENDENT_AMBULATORY_CARE_PROVIDER_SITE_OTHER)
Admission: RE | Admit: 2023-04-02 | Discharge: 2023-04-02 | Disposition: A | Discharge status: Home / Self Care | Payer: Medicare HMO | Source: Ambulatory Visit | Attending: Vascular Surgery | Admitting: Vascular Surgery

## 2023-04-02 ENCOUNTER — Ambulatory Visit (HOSPITAL_COMMUNITY)
Admission: RE | Admit: 2023-04-02 | Discharge: 2023-04-02 | Disposition: A | Discharge status: Home / Self Care | Payer: Medicare HMO | Source: Ambulatory Visit | Attending: Vascular Surgery | Admitting: Vascular Surgery

## 2023-04-02 DIAGNOSIS — Z95828 Presence of other vascular implants and grafts: Secondary | ICD-10-CM | POA: Diagnosis present

## 2023-04-16 ENCOUNTER — Ambulatory Visit: Payer: Medicare HMO | Admitting: Vascular Surgery

## 2023-04-21 NOTE — Progress Notes (Unsigned)
 [facility] [place] HISTORY AND PHYSICAL   History of Present Illness:  Patient is a 73 y.o. year old female who presents for evaluation of abdominal aortic aneurysm s/p EVAR by DR. Fields on 07/19/14.  She has also been followed for a left popliteal aneurysm with a last measurement of 0.93 cm 1 year ago.    Denies abdominal pain, denies claudication, rest pain or non healing wounds.  She is here for repeat studies and surveillance of the above issues.   Her main complaint on today's visit was food poisoning and late January, which she has struggled to recover from.  Kadeidra continues to be active, doing push-ups daily, walking daily.  While she feels like she is improving slowly, she is not back to baseline, especially from a pulmonary standpoint.  She is  a on a daily ASA, she is no longer on a Stain.  She smokes about 4 cigarettes daily.        Past Medical History:  Diagnosis Date   AAA (abdominal aortic aneurysm) without rupture (HCC) 07/06/2014   Allergy    Per pt   Complication of anesthesia    hard to wake up (pt does not want Versed)   Cough    allergies   Essential hypertension    takes Lisinopril daily   GERD (gastroesophageal reflux disease)    once in a while-will take OTC meds if needed   Headache    occasionally   History of bronchitis    a yr ago   Hyperlipidemia    borderline;was on meds but has been off x 12 yrs    Insomnia    takes Lorazepam as needed   Microscopic hematuria    SBO (small bowel obstruction) (HCC) 07/2018   URI (upper respiratory infection)    Jan 2016    Past Surgical History:  Procedure Laterality Date   ABDOMINAL AORTIC ENDOVASCULAR STENT GRAFT N/A 07/19/2014   Procedure: ABDOMINAL AORTIC ENDOVASCULAR STENT GRAFT;  Surgeon: Sherren Kerns, MD;  Location: [facility] OR;  Service: Vascular;  Laterality: N/A;   BREAST FIBROADENOMA SURGERY Left 1986   lumpectomy   COLONOSCOPY     TONSILLECTOMY  as a child     Social  History Social History   Tobacco Use   Smoking status: Light Smoker    Current packs/day: 0.00    Average packs/day: 0.3 packs/day for 25.1 years (6.3 ttl pk-yrs)    Types: Cigarettes    Start date: 02/18/1991    Last attempt to quit: 03/20/2016    Years since quitting: 7.0    Passive exposure: Never   Smokeless tobacco: Never   Tobacco comments:    6 cigarettes per day  Vaping Use   Vaping status: Never Used  Substance Use Topics   Alcohol use: Yes    Comment: 5-6 drinks per week   Drug use: No    Family History Family History  Problem Relation Age of Onset   Melanoma Father    Hypertension Sister    Crohn's disease Brother    Colon cancer Neg Hx    Esophageal cancer Neg Hx    Stomach cancer Neg Hx    Rectal cancer Neg Hx     Allergies  Allergies  Allergen Reactions   Versed [Midazolam]    Tramadol Diarrhea    Other reaction(s): GI Upset (intolerance)   Nitrofurantoin     Other reaction(s): Arthralgias (intolerance) Will never have this med again  Current Outpatient Medications  Medication Sig Dispense Refill   amLODipine (NORVASC) 5 MG tablet Take 1 tablet by mouth daily.     aspirin EC 81 MG tablet Take 81 mg by mouth daily.      calcium carbonate 1250 MG capsule Take 1,250 mg by mouth.     Coenzyme Q10 (CO Q 10) 10 MG CAPS Take 100 mg by mouth.     Cyanocobalamin (B-12) 5000 MCG SUBL      estradiol (ESTRACE) 0.1 MG/GM vaginal cream Place 1 Applicatorful vaginally once a week.      hydrocortisone (ANUSOL-HC) 25 MG suppository Place 1 suppository (25 mg total) rectally at bedtime. 14 suppository 0   LORazepam (ATIVAN) 1 MG tablet Take 1 mg by mouth as needed for anxiety.      Multiple Vitamins-Minerals (ONE DAILY PLUS MINERALS) TABS Take 1 tablet by mouth.      pantoprazole (PROTONIX) 40 MG tablet Take 40 mg by mouth daily.     potassium chloride SA (K-DUR) 20 MEQ tablet Take 2 tablets (40 mEq total) by mouth daily for 3 days. 6 tablet 0   No current  facility-administered medications for this visit.    ROS:   General:  No weight loss, Fever, chills  HEENT: No recent headaches, no nasal bleeding, no visual changes, no sore throat  Neurologic: No dizziness, blackouts, seizures. No recent symptoms of stroke or mini- stroke. No recent episodes of slurred speech, or temporary blindness.  Cardiac: No recent episodes of chest pain/pressure, no shortness of breath at rest.  No shortness of breath with exertion.  Denies history of atrial fibrillation or irregular heartbeat  Vascular: No history of rest pain in feet.  No history of claudication.  No history of non-healing ulcer, No history of DVT   Pulmonary: No home oxygen, no productive cough, no hemoptysis,  No asthma or wheezing  Musculoskeletal:  [ ]  Arthritis, [ ]  Low back pain,  [ ]  Joint pain  Hematologic:No history of hypercoagulable state.  No history of easy bleeding.  No history of anemia  Gastrointestinal: No hematochezia or melena,  No gastroesophageal reflux, no trouble swallowing  Urinary: [ ]  chronic Kidney disease, [ ]  on HD - [ ]  MWF or [ ]  TTHS, [ ]  Burning with urination, [ ]  Frequent urination, [ ]  Difficulty urinating;   Skin: No rashes  Psychological: No history of anxiety,  No history of depression   Physical Examination  There were no vitals filed for this visit.  There is no height or weight on file to calculate BMI.  General:  Alert and oriented, no acute distress HEENT: Normal Neck: No bruit or JVD Pulmonary: Clear to auscultation bilaterally Cardiac: Regular Rate and Rhythm without murmur Gastrointestinal: Soft, non-tender, non-distended, no mass, no scars Skin: No rash Extremity Pulses:  2+ radial,femoral, dorsalis pedis,  pulses bilaterally Musculoskeletal: No deformity or edema  Neurologic: Upper and lower extremity motor 5/5 and symmetric  DATA:  Endovascular Aortic Repair (EVAR):   +----------+----------------+-------------------+-------------------+           Diameter AP (cm)Diameter Trans (cm)Velocities (cm/sec)  +----------+----------------+-------------------+-------------------+  Aorta    3.28            3.21               68                   +----------+----------------+-------------------+-------------------+  Right Limb1.63            1.67  37                   +----------+----------------+-------------------+-------------------+  Left Limb 1.45            1.42               45                   +----------+----------------+-------------------+-------------------+        Summary:  Abdominal Aorta: Patent endovascular aneurysm repair with no evidence of  endoleak. The largest aortic diameter remains essentially unchanged  compared to prior exam. Previous diameter measurement was 3.3 cm obtained  on 03/15/2021.      +--------------+-------+-----------+---------+--------+-----+--------+  Left PoplitealAP (cm)Transv (cm)Waveform StenosisShapeComments  +--------------+-------+-----------+---------+--------+-----+--------+  Proximal     0.91   0.93       triphasic                       +--------------+-------+-----------+---------+--------+-----+--------+  Mid          0.65   0.62       biphasic                        +--------------+-------+-----------+---------+--------+-----+--------+  Distal       0.61   0.66       biphasic                        +--------------+-------+-----------+---------+--------+-----+--------+       Summary:  Left: Mild dilatation of the proximal popliteal artery.   ASSESSMENT/PLAN: Patient is a 72 y.o. female who presents for evaluation of abdominal aortic aneurysm s/p EVAR by DR. Fields on 07/19/14.  She has also been followed for a left popliteal ectasia.  The AAA duplex shows unchanged with a stable diameter of 3.26.  She remains asymptomatic. Left: Mild  dilatation of the proximal popliteal artery. Largest 0.55mm -5 years ago, the popliteal artery measured 0.82 mm.    She has palpable pedal pulses and no history of LE ischemic changes.   Overall, I am happy with how she is doing.  I asked her to call our office in 1 month's time, if she does not feel like she is back to her baseline after having food poisoning and January.  Regarding her EVAR-will restudy in 1 year. Regarding ectasia of the left popliteal artery, she has had 1 mm of growth over the last 5 years.  I will recheck this in 2 years.    Victorino Sparrow, MD [facility] [place] Office: [phone]

## 2023-04-23 ENCOUNTER — Ambulatory Visit: Payer: Medicare HMO | Admitting: Vascular Surgery

## 2023-04-23 ENCOUNTER — Encounter: Payer: Self-pay | Admitting: Vascular Surgery

## 2023-04-23 VITALS — BP 151/92 | HR 66 | Temp 97.8°F | Resp 20 | Ht 64.0 in | Wt 142.0 lb

## 2023-04-23 DIAGNOSIS — Z95828 Presence of other vascular implants and grafts: Secondary | ICD-10-CM | POA: Diagnosis not present

## 2023-07-24 ENCOUNTER — Telehealth: Payer: Self-pay

## 2023-07-24 NOTE — Telephone Encounter (Addendum)
 Patient called asking for cardiology referral due to heart palpitations.   Wadell Guild, RN  Kayla Part, MD Pt called asking about cardiology referral.  Looks like you saw her for an office visit on 04/23/23. Having heart palpitations.  I would normally send her to her PCP but she specifically said you would refer her.  Thanks, Kerney Pee   07/27/23 Kayla Part, MD  Wadell Guild, RN Can we refer her to Vonna Guardian for heart palpitations  - Eli  Referral made.

## 2023-07-29 ENCOUNTER — Telehealth: Payer: Self-pay

## 2023-07-29 NOTE — Telephone Encounter (Signed)
 1245 - Patient called inquiring if Dr Rosalva Comber would make a referral to the cardiologist as she has had an increase in [facility].  Dr Rosalva Comber and patient had discussed this information at her last appointment and patient stated that with her increased work of breathing it was time to see the cardiologist.

## 2023-08-17 ENCOUNTER — Encounter (HOSPITAL_BASED_OUTPATIENT_CLINIC_OR_DEPARTMENT_OTHER): Payer: Self-pay

## 2023-08-18 ENCOUNTER — Telehealth: Payer: Self-pay | Admitting: *Deleted

## 2023-08-18 ENCOUNTER — Encounter: Payer: Self-pay | Admitting: Cardiology

## 2023-08-18 ENCOUNTER — Ambulatory Visit: Attending: Cardiology | Admitting: Cardiology

## 2023-08-18 ENCOUNTER — Ambulatory Visit

## 2023-08-18 VITALS — BP 125/79 | HR 67 | Ht 66.0 in | Wt 141.2 lb

## 2023-08-18 DIAGNOSIS — Z95828 Presence of other vascular implants and grafts: Secondary | ICD-10-CM

## 2023-08-18 DIAGNOSIS — R0602 Shortness of breath: Secondary | ICD-10-CM | POA: Diagnosis not present

## 2023-08-18 DIAGNOSIS — Z8679 Personal history of other diseases of the circulatory system: Secondary | ICD-10-CM

## 2023-08-18 DIAGNOSIS — R002 Palpitations: Secondary | ICD-10-CM

## 2023-08-18 DIAGNOSIS — Z9889 Other specified postprocedural states: Secondary | ICD-10-CM | POA: Diagnosis not present

## 2023-08-18 DIAGNOSIS — M79603 Pain in arm, unspecified: Secondary | ICD-10-CM

## 2023-08-18 NOTE — Progress Notes (Signed)
 Cardiology Office Note:   Date:  08/23/2023  ID:  Gloria Morrison, DOB [birth year], MRN [id number] PCP: Joshua Santana CROME, NP  [facility] Providers Cardiologist:  None    History of Present Illness:   Discussed the use of AI scribe software for clinical note transcription with the patient, who gave verbal consent to proceed.  History of Present Illness Gloria Morrison is a 73 year old female who presents with palpitations and shortness of breath on exertion. She was referred for evaluation of these symptoms.  She experiences palpitations and shortness of breath primarily upon exertion, such as climbing stairs. These symptoms began after an episode of acute food poisoning in January, which took two weeks to recover from. She reports that her heart rate has been increasing more than usual after exertion, although not extremely high, and she experiences shortness of breath. These episodes do not occur at rest and are not associated with chest pain.  In April, she developed pneumonia, which she says exacerbated her symptoms. Her symptoms have actually been improving over the past week. The palpitations are described as a 'flutter' followed by an increased heart rate. The episodes are not predictable and have occurred five to six times in the past week.  She has a history of smoking for over twenty years but quit a couple of months ago. She has not noticed much improvement in her breathing since quitting smoking and is unsure if there has been any change, noting that her breathing has been different since her recent pneumonia. She denies any history of heart disease, diabetes, or stroke. She takes a mild blood pressure medication, with her blood pressure typically ranging from 120-130/68-73.  She underwent a stress test in 2016 as part of a preoperative evaluation for a triple A repair, which showed no ST segment deviation, low risk study. Reports upcoming appointment for lung cancer screening  CT.  Denies swelling in her legs, inability to lay flat, or needing to prop herself up on pillows at night. Her home blood pressure readings are generally good, and her heart rate is around 67 with oxygen saturation at 94%.   Studies Reviewed:    EKG:   EKG Interpretation Date/Time:  Tuesday August 18 2023 14:46:08 EDT Ventricular Rate:  67 PR Interval:  178 QRS Duration:  72 QT Interval:  390 QTC Calculation: 412 R Axis:   -55  Text Interpretation: Normal sinus rhythm Left axis deviation Confirmed by Trudy Birmingham [phone]) on 08/18/2023 2:50:12 PM    07/14/2014 Stress Test  There was no ST segment deviation noted during stress. This is a low risk study. Breast attenuation and nearby radiotracer uptake within the gut limit the study, however there are no obvious defects to suggest scar or ischemia. LVEF 65%.    Risk Assessment/Calculations:       Physical Exam:   VS:  BP 125/79   Pulse 67   Ht 5' 6 (1.676 m)   Wt 141 lb 3.2 oz (64 kg)   SpO2 94%   BMI 22.79 kg/m    Wt Readings from Last 3 Encounters:  08/18/23 141 lb 3.2 oz (64 kg)  04/23/23 142 lb (64.4 kg)  03/21/22 141 lb 3.2 oz (64 kg)     Physical Exam Vitals reviewed.  Constitutional:      Appearance: Normal appearance.  HENT:     Head: Normocephalic.     Nose: Nose normal.  Eyes:     Pupils: Pupils are equal, round, and  reactive to light.  Cardiovascular:     Rate and Rhythm: Normal rate and regular rhythm.     Pulses: Normal pulses.     Heart sounds: Normal heart sounds. No murmur heard.    No friction rub. No gallop.  Pulmonary:     Effort: Pulmonary effort is normal.     Breath sounds: Normal breath sounds.     Comments: Breath sounds minimally distant Abdominal:     General: Abdomen is flat.  Musculoskeletal:     Right lower leg: No edema.     Left lower leg: No edema.  Skin:    General: Skin is warm and dry.     Capillary Refill: Capillary refill takes less than 2 seconds.  Neurological:      General: No focal deficit present.     Mental Status: She is alert and oriented to person, place, and time.  Psychiatric:        Mood and Affect: Mood normal.        Behavior: Behavior normal.        Thought Content: Thought content normal.        Judgment: Judgment normal.     ASSESSMENT AND PLAN:    Assessment & Plan Palpitations with dyspnea Intermittent palpitations since January, exacerbated by exertion, with no associated chest pain or predictable pattern. Symptoms worsened post-pneumonia in April but have since improved slightly recently. EKG shows no acute changes or arrhythmias. Low concern for acute cardiac issue, but does have risk factors include smoking history and hypertension. - Place heart monitor for 7 days to capture episodes of rapid HR - Order echocardiogram to assess heart valves and pumping function - Consider repeat stress test if echocardiogram abnormal  Hypertension Hypertension well-controlled with medication. Blood pressure typically ranges from 120-130/68-73. - Continue current antihypertensive regimen  Elevated cholesterol Total cholesterol was 212-214 with LDL at 129 in December. Discussed risks of elevated LDL and potential need for medication. She prefers lifestyle changes over statins. - Set goal for LDL to be less than 100, ideally closer to 70 - Encourage dietary changes to reduce saturated fats and red meats  Nicotine  dependence, in remission Recently quit smoking after 20 years. Smoking history has contributed to changes in lung and heart function. - Encourage continued smoking cessation           Signed, Artist Pouch, PA-C

## 2023-08-18 NOTE — Telephone Encounter (Signed)
-----   Message from Woodie LOISE Prudent sent at 08/18/2023  3:44 PM EDT ----- Regarding: RE: 7 day zio for palpitations per Artist Pouch PA-C done ----- Message ----- From: Gladis Porter HERO, LPN Sent: 03/27/7972   3:30 PM EDT To: Woodie LOISE Prudent Subject: 7 day zio for palpitations per Artist Sherrel Artist saw this pt a few mins ago and ordered a 7 day zio for palpitations.  Orders is in.  The pt had to leave to go and pick a [place] at the airport, and unable to stay for monitor placement  Order is in  Can you please enroll and mail, and let me know when you do?   Thanks friend for all your help, Porter

## 2023-08-18 NOTE — Patient Instructions (Signed)
 Medication Instructions:   Your physician recommends that you continue on your current medications as directed. Please refer to the Current Medication list given to you today.  *If you need a refill on your cardiac medications before your next appointment, please call your pharmacy*   Testing/Procedures:  Your physician has requested that you have an echocardiogram. Echocardiography is a painless test that uses sound waves to create images of your heart. It provides your doctor with information about the size and shape of your heart and how well your heart's chambers and valves are working. This procedure takes approximately one hour. There are no restrictions for this procedure. Please do NOT wear cologne, perfume, aftershave, or lotions (deodorant is allowed). Please arrive 15 minutes prior to your appointment time.  Please note: We ask at that you not bring children with you during ultrasound (echo/ vascular) testing. Due to room size and safety concerns, children are not allowed in the ultrasound rooms during exams. Our front office staff cannot provide observation of children in our lobby area while testing is being conducted. An adult accompanying a patient to their appointment will only be allowed in the ultrasound room at the discretion of the ultrasound technician under special circumstances. We apologize for any inconvenience.   ZIO XT- Long Term Monitor Instructions  Your physician has requested you wear a ZIO patch monitor for 7 days.  This is a single patch monitor. [employer] supplies one patch monitor per enrollment. Additional stickers are not available. Please do not apply patch if you will be having a Nuclear Stress Test,  Echocardiogram, Cardiac CT, MRI, or Chest Xray during the period you would be wearing the  monitor. The patch cannot be worn during these tests. You cannot remove and re-apply the  ZIO XT patch monitor.  Your ZIO patch monitor will be mailed 3 day USPS to  your address on file. It may take 3-5 days  to receive your monitor after you have been enrolled.  Once you have received your monitor, please review the enclosed instructions. Your monitor  has already been registered assigning a specific monitor serial # to you.  Billing and Patient Assistance Program Information  We have supplied [employer] with any of your insurance information on file for billing purposes. [employer] offers a sliding scale Patient Assistance Program for patients that do not have  insurance, or whose insurance does not completely cover the cost of the ZIO monitor.  You must apply for the Patient Assistance Program to qualify for this discounted rate.  To apply, please call [employer] at [phone], select option 4, select option 2, ask to apply for  Patient Assistance Program. Meredeth will ask your household income, and how many people  are in your household. They will quote your out-of-pocket cost based on that information.  [employer] will also be able to set up a 57-month, interest-free payment plan if needed.  Applying the monitor   Shave hair from upper left chest.  Hold abrader disc by orange tab. Rub abrader in 40 strokes over the upper left chest as  indicated in your monitor instructions.  Clean area with 4 enclosed alcohol  pads. Let dry.  Apply patch as indicated in monitor instructions. Patch will be placed under collarbone on left  side of chest with arrow pointing upward.  Rub patch adhesive wings for 2 minutes. Remove white label marked 1. Remove the white  label marked 2. Rub patch adhesive wings for 2 additional minutes.  While looking in a mirror,  press and release button in center of patch. A small green light will  flash 3-4 times. This will be your only indicator that the monitor has been turned on.  Do not shower for the first 24 hours. You may shower after the first 24 hours.  Press the button if you feel a symptom. You will hear a small click. Record  Date, Time and  Symptom in the Patient Logbook.  When you are ready to remove the patch, follow instructions on the last 2 pages of Patient  Logbook. Stick patch monitor onto the last page of Patient Logbook.  Place Patient Logbook in the blue and white box. Use locking tab on box and tape box closed  securely. The blue and white box has prepaid postage on it. Please place it in the mailbox as  soon as possible. Your physician should have your test results approximately 7 days after the  monitor has been mailed back to [facility].  Call [facility] Customer Care at [phone] if you have questions regarding  your ZIO XT patch monitor. Call them immediately if you see an orange light blinking on your  monitor.  If your monitor falls off in less than 4 days, contact our Monitor department at [phone].  If your monitor becomes loose or falls off after 4 days call [employer] at [phone] for  suggestions on securing your monitor   Follow-Up:  IN 6-7 WEEKS WITH AN EXTENDER IN THE OFFICE

## 2023-08-18 NOTE — Progress Notes (Unsigned)
 Enrolled patient for a 7 day Zio XT monitor to be mailed to patients home.

## 2023-08-23 DIAGNOSIS — R002 Palpitations: Secondary | ICD-10-CM | POA: Insufficient documentation

## 2023-08-23 DIAGNOSIS — R0602 Shortness of breath: Secondary | ICD-10-CM | POA: Insufficient documentation

## 2023-09-15 ENCOUNTER — Ambulatory Visit (HOSPITAL_COMMUNITY)
Admission: RE | Admit: 2023-09-15 | Discharge: 2023-09-15 | Disposition: A | Discharge status: Home / Self Care | Source: Ambulatory Visit | Attending: Internal Medicine | Admitting: Internal Medicine

## 2023-09-15 DIAGNOSIS — E785 Hyperlipidemia, unspecified: Secondary | ICD-10-CM | POA: Diagnosis not present

## 2023-09-15 DIAGNOSIS — Z95828 Presence of other vascular implants and grafts: Secondary | ICD-10-CM | POA: Diagnosis present

## 2023-09-15 DIAGNOSIS — I083 Combined rheumatic disorders of mitral, aortic and tricuspid valves: Secondary | ICD-10-CM | POA: Insufficient documentation

## 2023-09-15 DIAGNOSIS — Z8679 Personal history of other diseases of the circulatory system: Secondary | ICD-10-CM | POA: Insufficient documentation

## 2023-09-15 DIAGNOSIS — R002 Palpitations: Secondary | ICD-10-CM | POA: Insufficient documentation

## 2023-09-15 DIAGNOSIS — Z9889 Other specified postprocedural states: Secondary | ICD-10-CM | POA: Insufficient documentation

## 2023-09-15 DIAGNOSIS — I10 Essential (primary) hypertension: Secondary | ICD-10-CM | POA: Insufficient documentation

## 2023-09-15 LAB — ECHOCARDIOGRAM COMPLETE
AR max vel: 3.79 cm2
AV Peak grad: 6.4 mmHg
Ao pk vel: 1.26 m/s
S' Lateral: 2.34 cm

## 2023-09-17 ENCOUNTER — Ambulatory Visit: Payer: Self-pay | Admitting: Cardiology

## 2023-10-07 NOTE — Progress Notes (Deleted)
 Cardiology Office Note    Date:  10/07/2023  ID:  Quaniya, Damas May 14, 1950, MRN [id number] PCP:  Joshua Santana CROME, NP  Cardiologist:  None  Electrophysiologist:  None   Chief Complaint: ***  History of Present Illness: .    DENAE ZULUETA is a 73 y.o. female with visit-pertinent history of palpitations, dyspnea on exertion and hypertension. Patient denies any history of heart disease, diabetes or stroke.  Patient was seen in clinic on 08/18/2023 by Artist Pouch, PA for evaluation of palpitations and shortness of breath on exertion.  Patient reported experiencing palpitations and shortness of breath primarily upon exertion such as when climbing stairs.  It was noted these symptoms began after an episode of acute food poisoning in January which took 2 weeks to recover from.  She reported that her heart rate had been increasing more than usual after exertion although not extremely high and she experience shortness of breath.  Noted the episodes do not occur at rest and are not associated with chest pain.  In April she developed pneumonia which she said exacerbated her symptoms, she noted that her symptoms had actually been improving over the past weeks and palpitations were described as a flutter followed by an increased heart rate.  Noted episodes are not predictable and have occurred 5-6 times in the past week.   Patient noted to have a history of smoking for over 20 years however quit a few months ago.  It was recommended that patient wear 7-day cardiac monitor and check echocardiogram.  Patient's cardiac monitor indicated Echocardiogram on 09/15/2023 indicated LVEF 65 to 70%, no RWMA, mild concentric LVH, grade 1 diastolic dysfunction, RV systolic function was normal, RV mildly enlarged, LA was mild to moderately dilated, RA mild to moderately dilated, mild mitral valve regurgitation with no evidence of stenosis, tricuspid valve regurgitation was mild to moderate, moderate calcification  of the aortic valve with no evidence of stenosis.  Today she presents for follow-up.  She reports that she  Palpitations:   Hypertension: Blood pressure today  Elevated cholesterol:   Nicotine  dependence:      Labwork independently reviewed:   ROS: .   *** denies chest pain, shortness of breath, lower extremity edema, fatigue, palpitations, melena, hematuria, hemoptysis, diaphoresis, weakness, presyncope, syncope, orthopnea, and PND.  All other systems are reviewed and otherwise negative.  Studies Reviewed: SABRA    EKG:  EKG is ordered today, personally reviewed, demonstrating ***     CV Studies: Cardiac studies reviewed are outlined and summarized above. Otherwise please see EMR for full report. Cardiac Studies & Procedures   ______________________________________________________________________________________________   STRESS TESTS  NM MYOCAR MULTI W/SPECT W 07/14/2014  Narrative  There was no ST segment deviation noted during stress.  This is a low risk study.  Breast attenuation and nearby radiotracer uptake within the gut limit the study, however there are no obvious defects to suggest scar or ischemia. LVEF 65%.   ECHOCARDIOGRAM  ECHOCARDIOGRAM COMPLETE 09/15/2023  Narrative ECHOCARDIOGRAM REPORT    Patient Name:   BRYLEA PITA Date of Exam: 09/15/2023 Medical Rec #:  [id number]       Height:       66.0 in Accession #:    [id number]      Weight:       141.2 lb Date of Birth:  02-11-1951       BSA:          1.725 m Patient Age:    68  years        BP:           133/88 mmHg Patient Gender: F               HR:           59 bpm. Exam Location:  [address]  Procedure: 2D Echo, Cardiac Doppler and Color Doppler (Both Spectral and Color Flow Doppler were utilized during procedure).  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 Signs/Symptoms:Cough; Risk Factors:Hypertension and Hyperlipidemia. AAA Repair.  Sonographer:    Cherene Ravens RDCS Referring Phys: [id number] ARTIST POUCH  IMPRESSIONS   1. Left ventricular ejection fraction, by estimation, is 65 to 70%. The left ventricle has normal function. The left ventricle has no regional wall motion abnormalities. There is mild concentric left ventricular hypertrophy. Left ventricular diastolic parameters are consistent with Grade I diastolic dysfunction (impaired relaxation). 2. Right ventricular systolic function is normal. The right ventricular size is mildly enlarged. 3. Left atrial size was mild to moderately dilated. 4. Right atrial size was mild to moderately dilated. 5. The mitral valve is degenerative. Mild mitral valve regurgitation. No evidence of mitral stenosis. 6. Tricuspid valve regurgitation is mild to moderate. 7. The aortic valve is tricuspid. There is moderate calcification of the aortic valve. Aortic valve regurgitation is mild. Aortic valve sclerosis/calcification is present, without any evidence of aortic stenosis. 8. The inferior vena cava is dilated in size with >50% respiratory variability, suggesting right atrial pressure of 8 mmHg.  FINDINGS Left Ventricle: Left ventricular ejection fraction, by estimation, is 65 to 70%. The left ventricle has normal function. The left ventricle has no regional wall motion abnormalities. The left ventricular internal cavity size was normal in size. There is mild concentric left ventricular hypertrophy. Left ventricular diastolic parameters are consistent with Grade I diastolic dysfunction (impaired relaxation).  Right Ventricle: The right ventricular size is mildly enlarged. No increase in right ventricular wall thickness. Right ventricular systolic function is normal.  Left Atrium: Left atrial size was mild to moderately dilated.  Right Atrium: Right atrial size was mild to moderately  dilated.  Pericardium: There is no evidence of pericardial effusion.  Mitral Valve: The mitral valve is degenerative in appearance. Mild mitral valve regurgitation. No evidence of mitral valve stenosis.  Tricuspid Valve: The tricuspid valve is normal in structure. Tricuspid valve regurgitation is mild to moderate. No evidence of tricuspid stenosis.  Aortic Valve: The aortic valve is tricuspid. There is moderate calcification of the aortic valve. Aortic valve regurgitation is mild. Aortic valve sclerosis/calcification is present, without any evidence of aortic stenosis. Aortic valve peak gradient measures 6.4 mmHg.  Pulmonic Valve: The pulmonic valve was normal in structure. Pulmonic valve regurgitation is trivial. No evidence of pulmonic stenosis.  Aorta: The aortic root is normal in size and structure.  Venous: The inferior vena cava is dilated in size with greater than 50% respiratory variability, suggesting right atrial pressure of 8 mmHg.  IAS/Shunts: No atrial level shunt detected by color flow Doppler.   LEFT VENTRICLE PLAX 2D LVIDd:         4.22 cm   Diastology LVIDs:         2.34 cm   LV e' medial:    7.62 cm/s LV PW:         0.99 cm   LV  E/e' medial:  10.5 LV IVS:        1.15 cm   LV e' lateral:   10.60 cm/s LVOT diam:     2.30 cm   LV E/e' lateral: 7.5 LV SV:         111 LV SV Index:   64 LVOT Area:     4.15 cm   RIGHT VENTRICLE             IVC RV Basal diam:  3.74 cm     IVC diam: 2.21 cm RV Mid diam:    3.12 cm RV S prime:     12.90 cm/s TAPSE (M-mode): 2.9 cm RVSP:           32.0 mmHg  LEFT ATRIUM             Index        RIGHT ATRIUM           Index LA Vol (A2C):   62.4 ml 36.18 ml/m  RA Pressure: 8.00 mmHg LA Vol (A4C):   62.2 ml 36.06 ml/m  RA Area:     16.60 cm LA Biplane Vol: 63.9 ml 37.05 ml/m  RA Volume:   41.50 ml  24.06 ml/m AORTIC VALVE AV Area (Vmax): 3.79 cm AV Vmax:        126.00 cm/s AV Peak Grad:   6.4 mmHg LVOT Vmax:      115.00  cm/s LVOT Vmean:     73.900 cm/s LVOT VTI:       0.267 m  AORTA Ao Root diam: 3.70 cm Ao Asc diam:  3.40 cm  MV E velocity: 79.70 cm/s  TRICUSPID VALVE MV A velocity: 80.20 cm/s  TV Peak grad:   23.0 mmHg MV E/A ratio:  0.99        TV Vmax:        2.40 m/s TR Peak grad:   24.0 mmHg TR Vmax:        245.00 cm/s Estimated RAP:  8.00 mmHg RVSP:           32.0 mmHg  SHUNTS Systemic VTI:  0.27 m Systemic Diam: 2.30 cm  Toribio Fuel MD Electronically signed by Toribio Fuel MD Signature Date/Time: 09/15/2023/9:46:41 PM    Final          ______________________________________________________________________________________________       Current Reported Medications:.    No outpatient medications have been marked as taking for the 10/09/23 encounter (Appointment) with Kree Armato D, NP.    Physical Exam:    VS:  There were no vitals taken for this visit.   Wt Readings from Last 3 Encounters:  08/18/23 141 lb 3.2 oz (64 kg)  04/23/23 142 lb (64.4 kg)  03/21/22 141 lb 3.2 oz (64 kg)    GEN: Well nourished, well developed in no acute distress NECK: No JVD; No carotid bruits CARDIAC: ***RRR, no murmurs, rubs, gallops RESPIRATORY:  Clear to auscultation without rales, wheezing or rhonchi  ABDOMEN: Soft, non-tender, non-distended EXTREMITIES:  No edema; No acute deformity     Asessement and Plan:.     ***     Disposition: F/u with ***  Signed, Treasure Ingrum D Edita Weyenberg, NP

## 2023-10-09 ENCOUNTER — Ambulatory Visit: Admitting: Cardiology

## 2023-10-09 DIAGNOSIS — R002 Palpitations: Secondary | ICD-10-CM

## 2023-10-09 DIAGNOSIS — R0602 Shortness of breath: Secondary | ICD-10-CM

## 2023-10-20 NOTE — Progress Notes (Deleted)
 Cardiology Office Note    Date:  10/20/2023  ID:  Lanny, Donoso [birth year], MRN [id number] PCP:  Joshua Santana CROME, NP  Cardiologist:  None  Electrophysiologist:  None   Chief Complaint: ***  History of Present Illness: .    Gloria Morrison is a 73 y.o. female with visit-pertinent history of palpitations, dyspnea on exertion and hypertension. Patient without any history of heart disease, diabetes or stroke.  Patient was seen in clinic on 08/18/2023 by Artist Pouch, PA for evaluation of palpitations and shortness of breath on exertion.  Patient reported experiencing palpitations and shortness of breath primarily upon exertion such as when climbing stairs.  It was noted these symptoms began after an episode of acute food poisoning in January which took 2 weeks to recover from.  She reported that her heart rate had been increasing more than usual after exertion although not extremely high and she experience shortness of breath.  Noted the episodes do not occur at rest and are not associated with chest pain.  In April she developed pneumonia which she said exacerbated her symptoms, she noted that her symptoms had actually been improving over the past weeks and palpitations were described as a flutter followed by an increased heart rate.  Noted episodes are not predictable and have occurred 5-6 times in the past week.   Patient noted to have a history of smoking for over 20 years however quit a few months ago.  It was recommended that patient wear 7-day cardiac monitor and check echocardiogram.  Patient's cardiac monitor indicated Echocardiogram on 09/15/2023 indicated LVEF 65 to 70%, no RWMA, mild concentric LVH, grade 1 diastolic dysfunction, RV systolic function was normal, RV mildly enlarged, LA was mild to moderately dilated, RA mild to moderately dilated, mild mitral valve regurgitation with no evidence of stenosis, tricuspid valve regurgitation was mild to moderate, moderate calcification  of the aortic valve with no evidence of stenosis.  Today she presents for follow-up.  She reports that she  Palpitations:   Hypertension: Blood pressure today  Elevated cholesterol:   Nicotine  dependence:      Labwork independently reviewed:   ROS: .   *** denies chest pain, shortness of breath, lower extremity edema, fatigue, palpitations, melena, hematuria, hemoptysis, diaphoresis, weakness, presyncope, syncope, orthopnea, and PND.  All other systems are reviewed and otherwise negative.  Studies Reviewed: SABRA    EKG:  EKG is ordered today, personally reviewed, demonstrating ***     CV Studies: Cardiac studies reviewed are outlined and summarized above. Otherwise please see EMR for full report. Cardiac Studies & Procedures   ______________________________________________________________________________________________   STRESS TESTS  NM MYOCAR MULTI W/SPECT W 07/14/2014  Narrative  There was no ST segment deviation noted during stress.  This is a low risk study.  Breast attenuation and nearby radiotracer uptake within the gut limit the study, however there are no obvious defects to suggest scar or ischemia. LVEF 65%.   ECHOCARDIOGRAM  ECHOCARDIOGRAM COMPLETE 09/15/2023  Narrative ECHOCARDIOGRAM REPORT    Patient Name:   Gloria Morrison Date of Exam: 09/15/2023 Medical Rec #:  [id number]       Height:       66.0 in Accession #:    [id number]      Weight:       141.2 lb Date of Birth:  1950/03/17       BSA:          1.725 m Patient Age:    38  years        BP:           133/88 mmHg Patient Gender: F               HR:           59 bpm. Exam Location:  [address]  Procedure: 2D Echo, Cardiac Doppler and Color Doppler (Both Spectral and Color Flow Doppler were utilized during procedure).  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 Signs/Symptoms:Cough; Risk Factors:Hypertension and Hyperlipidemia. AAA Repair.  Sonographer:    Cherene Ravens RDCS Referring Phys: [id number] ARTIST POUCH  IMPRESSIONS   1. Left ventricular ejection fraction, by estimation, is 65 to 70%. The left ventricle has normal function. The left ventricle has no regional wall motion abnormalities. There is mild concentric left ventricular hypertrophy. Left ventricular diastolic parameters are consistent with Grade I diastolic dysfunction (impaired relaxation). 2. Right ventricular systolic function is normal. The right ventricular size is mildly enlarged. 3. Left atrial size was mild to moderately dilated. 4. Right atrial size was mild to moderately dilated. 5. The mitral valve is degenerative. Mild mitral valve regurgitation. No evidence of mitral stenosis. 6. Tricuspid valve regurgitation is mild to moderate. 7. The aortic valve is tricuspid. There is moderate calcification of the aortic valve. Aortic valve regurgitation is mild. Aortic valve sclerosis/calcification is present, without any evidence of aortic stenosis. 8. The inferior vena cava is dilated in size with >50% respiratory variability, suggesting right atrial pressure of 8 mmHg.  FINDINGS Left Ventricle: Left ventricular ejection fraction, by estimation, is 65 to 70%. The left ventricle has normal function. The left ventricle has no regional wall motion abnormalities. The left ventricular internal cavity size was normal in size. There is mild concentric left ventricular hypertrophy. Left ventricular diastolic parameters are consistent with Grade I diastolic dysfunction (impaired relaxation).  Right Ventricle: The right ventricular size is mildly enlarged. No increase in right ventricular wall thickness. Right ventricular systolic function is normal.  Left Atrium: Left atrial size was mild to moderately dilated.  Right Atrium: Right atrial size was mild to moderately  dilated.  Pericardium: There is no evidence of pericardial effusion.  Mitral Valve: The mitral valve is degenerative in appearance. Mild mitral valve regurgitation. No evidence of mitral valve stenosis.  Tricuspid Valve: The tricuspid valve is normal in structure. Tricuspid valve regurgitation is mild to moderate. No evidence of tricuspid stenosis.  Aortic Valve: The aortic valve is tricuspid. There is moderate calcification of the aortic valve. Aortic valve regurgitation is mild. Aortic valve sclerosis/calcification is present, without any evidence of aortic stenosis. Aortic valve peak gradient measures 6.4 mmHg.  Pulmonic Valve: The pulmonic valve was normal in structure. Pulmonic valve regurgitation is trivial. No evidence of pulmonic stenosis.  Aorta: The aortic root is normal in size and structure.  Venous: The inferior vena cava is dilated in size with greater than 50% respiratory variability, suggesting right atrial pressure of 8 mmHg.  IAS/Shunts: No atrial level shunt detected by color flow Doppler.   LEFT VENTRICLE PLAX 2D LVIDd:         4.22 cm   Diastology LVIDs:         2.34 cm   LV e' medial:    7.62 cm/s LV PW:         0.99 cm   LV  E/e' medial:  10.5 LV IVS:        1.15 cm   LV e' lateral:   10.60 cm/s LVOT diam:     2.30 cm   LV E/e' lateral: 7.5 LV SV:         111 LV SV Index:   64 LVOT Area:     4.15 cm   RIGHT VENTRICLE             IVC RV Basal diam:  3.74 cm     IVC diam: 2.21 cm RV Mid diam:    3.12 cm RV S prime:     12.90 cm/s TAPSE (M-mode): 2.9 cm RVSP:           32.0 mmHg  LEFT ATRIUM             Index        RIGHT ATRIUM           Index LA Vol (A2C):   62.4 ml 36.18 ml/m  RA Pressure: 8.00 mmHg LA Vol (A4C):   62.2 ml 36.06 ml/m  RA Area:     16.60 cm LA Biplane Vol: 63.9 ml 37.05 ml/m  RA Volume:   41.50 ml  24.06 ml/m AORTIC VALVE AV Area (Vmax): 3.79 cm AV Vmax:        126.00 cm/s AV Peak Grad:   6.4 mmHg LVOT Vmax:      115.00  cm/s LVOT Vmean:     73.900 cm/s LVOT VTI:       0.267 m  AORTA Ao Root diam: 3.70 cm Ao Asc diam:  3.40 cm  MV E velocity: 79.70 cm/s  TRICUSPID VALVE MV A velocity: 80.20 cm/s  TV Peak grad:   23.0 mmHg MV E/A ratio:  0.99        TV Vmax:        2.40 m/s TR Peak grad:   24.0 mmHg TR Vmax:        245.00 cm/s Estimated RAP:  8.00 mmHg RVSP:           32.0 mmHg  SHUNTS Systemic VTI:  0.27 m Systemic Diam: 2.30 cm  Toribio Fuel MD Electronically signed by Toribio Fuel MD Signature Date/Time: 09/15/2023/9:46:41 PM    Final          ______________________________________________________________________________________________       Current Reported Medications:.    No outpatient medications have been marked as taking for the 10/22/23 encounter (Appointment) with Remmy Riffe D, NP.    Physical Exam:    VS:  There were no vitals taken for this visit.   Wt Readings from Last 3 Encounters:  08/18/23 141 lb 3.2 oz (64 kg)  04/23/23 142 lb (64.4 kg)  03/21/22 141 lb 3.2 oz (64 kg)    GEN: Well nourished, well developed in no acute distress NECK: No JVD; No carotid bruits CARDIAC: ***RRR, no murmurs, rubs, gallops RESPIRATORY:  Clear to auscultation without rales, wheezing or rhonchi  ABDOMEN: Soft, non-tender, non-distended EXTREMITIES:  No edema; No acute deformity     Asessement and Plan:.     ***     Disposition: F/u with ***  Signed, Miki Labuda D Alwaleed Obeso, NP

## 2023-10-22 ENCOUNTER — Ambulatory Visit: Attending: Cardiology | Admitting: Cardiology

## 2024-01-16 ENCOUNTER — Encounter: Payer: Self-pay | Admitting: Internal Medicine

## 2024-02-23 ENCOUNTER — Telehealth: Payer: Self-pay

## 2024-02-23 NOTE — Telephone Encounter (Signed)
 Called and spoke with patient-patient advised of MD recommendations for colonopscopy being due; patient reports she would like the office to call her back in March 2026 to schedule these appts; Patient advised to call back to the office at [phone] should questions/concerns arise;  Patient verbalized understanding of information/instructions;
